# Patient Record
Sex: Female | Born: 2000 | Race: White | Hispanic: No | Marital: Single | Smoking: Never smoker
Health system: Southern US, Community
[De-identification: ages and names within clinical notes are randomized; demographics above are authoritative.]

## PROBLEM LIST (undated history)

## (undated) DIAGNOSIS — F329 Major depressive disorder, single episode, unspecified: Secondary | ICD-10-CM

## (undated) DIAGNOSIS — S43491A Other sprain of right shoulder joint, initial encounter: Secondary | ICD-10-CM

## (undated) DIAGNOSIS — F32A Depression, unspecified: Secondary | ICD-10-CM

## (undated) DIAGNOSIS — F419 Anxiety disorder, unspecified: Secondary | ICD-10-CM

## (undated) DIAGNOSIS — S43014A Anterior dislocation of right humerus, initial encounter: Secondary | ICD-10-CM

## (undated) DIAGNOSIS — B2790 Infectious mononucleosis, unspecified without complication: Secondary | ICD-10-CM

## (undated) HISTORY — DX: Major depressive disorder, single episode, unspecified: F32.9

## (undated) HISTORY — DX: Anterior dislocation of right humerus, initial encounter: S43.014A

## (undated) HISTORY — DX: Depression, unspecified: F32.A

## (undated) HISTORY — DX: Infectious mononucleosis, unspecified without complication: B27.90

## (undated) HISTORY — DX: Anxiety disorder, unspecified: F41.9

## (undated) HISTORY — DX: Other sprain of right shoulder joint, initial encounter: S43.491A

---
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
## 2020-02-23 ENCOUNTER — Other Ambulatory Visit: Payer: Self-pay

## 2020-02-23 ENCOUNTER — Encounter: Payer: Self-pay | Admitting: Nurse Practitioner

## 2020-02-23 ENCOUNTER — Ambulatory Visit: Payer: BC Managed Care – PPO | Admitting: Nurse Practitioner

## 2020-02-23 VITALS — BP 120/86 | HR 99 | Temp 98.6°F | Ht 65.5 in | Wt 166.0 lb

## 2020-02-23 DIAGNOSIS — J309 Allergic rhinitis, unspecified: Secondary | ICD-10-CM | POA: Insufficient documentation

## 2020-02-23 DIAGNOSIS — J029 Acute pharyngitis, unspecified: Secondary | ICD-10-CM | POA: Diagnosis not present

## 2020-02-23 DIAGNOSIS — J302 Other seasonal allergic rhinitis: Secondary | ICD-10-CM | POA: Diagnosis not present

## 2020-02-23 LAB — STREP GROUP A AG, W/REFLEX TO CULT: Streptococcus Group A AG: NOT DETECTED

## 2020-02-23 MED ORDER — FLUTICASONE PROPIONATE 50 MCG/ACT NA SUSP: 2.0000 | Freq: Every day | NASAL | 6 refills | Status: DC

## 2020-02-23 NOTE — Patient Instructions (Addendum)
F/u as needed  Allergies, Adult An allergy means that your body reacts to something that bothers it (allergen). This can happen from something that you eat, breathe in, or touch. Allergies often affect the nose, eyes, skin, and stomach. They can be mild, moderate, or very bad (severe). An allergy cannot spread from person to person. They can happen at any age. Sometimes, people outgrow them. What are the causes?  Outdoor things, such as pollen, car fumes, and mold.  Indoor things, such as dust, smoke, mold, and pets.  Foods.  Medicines.  Things that bother your skin, such as perfume and bug bites. What increases the risk?  Having family members with allergies or asthma. What are the signs or symptoms? Symptoms depend on how bad your allergy is. Mild to moderate symptoms  Runny nose, stuffy nose, or sneezing.  Itchy mouth, ears, or throat.  A feeling of mucus dripping down the back of your throat.  Sore throat.  Eyes that are itchy, red, watery, or puffy.  A skin rash, or red, swollen areas of skin (hives).  Stomach cramps or bloating. Severe symptoms Very bad allergies to food, medicine, or bug bites may cause a very bad allergy reaction (anaphylaxis). This can be life-threatening. Symptoms include:  A red face.  Wheezing or coughing.  Swollen lips, tongue, or mouth.  Tight or swollen throat.  Chest pain or tightness, or a fast heartbeat.  Trouble breathing or shortness of breath.  Pain in your belly (abdomen), vomiting, or watery poop (diarrhea).  Feeling dizzy or fainting. How is this treated? Treatment for this condition depends on your symptoms. Treatment may include:  Cold, wet cloths for itching and swelling.  Eye drops, nose sprays, or skin creams.  Washing out your nose each day.  A humidifier.  Medicines.  A change to the foods you eat.  Being exposed again and again to tiny amounts of allergens. This helps your body get used to them. You  might have: ? Allergy shots. ? Very small amounts of allergen put under your tongue.  An emergency shot (auto-injector pen) if you have a very bad allergy reaction. ? This is a medicine with a needle. You can put it into your skin by yourself. ? Your doctor will teach you how to use it.      Follow these instructions at home: Medicines  Take or apply over-the-counter and prescription medicines only as told by your doctor.  If you are at risk for a very bad allergy reaction, keep an auto-injector pen with you all the time.   Eating and drinking  Follow instructions from your doctor about what to eat and drink.  Drink enough fluid to keep your pee (urine) pale yellow. General instructions  If you have ever had a very bad allergy reaction, wear a medical alert bracelet or necklace.  Stay away from things that you are allergic to.  Keep all follow-up visits as told by your doctor. This is important. Contact a doctor if:  Your symptoms do not get better with treatment. Get help right away if:  You have symptoms of a very bad allergy reaction. These include: ? A swollen mouth, tongue, or throat. ? Pain or tightness in your chest. ? Trouble breathing. ? Being short of breath. ? Dizziness. ? Fainting. ? Very bad pain in your belly. ? Vomiting. ? Watery poop. These symptoms may be an emergency. Do not wait to see if the symptoms will go away. Get medical help right  away. Call your local emergency services (911 in the [place] Do not drive yourself to the hospital. Summary  Take or apply over-the-counter and prescription medicines only as told by your doctor.  Stay away from things you are allergic to.  If you are at risk for a very bad allergy reaction, carry an auto-injector pen all the time.  Wear a medical alert bracelet or necklace.  Very bad allergy reactions can be life-threatening. Get help right away. This information is not intended to replace advice given to you  by your health care provider. Make sure you discuss any questions you have with your health care provider. Document Revised: 10/29/2018 Document Reviewed: 10/29/2018 Elsevier Patient Education  2021 [employer].

## 2020-02-23 NOTE — Assessment & Plan Note (Addendum)
Chronic, ongoing.  Encouraged patient to resume cetirizine and flonase; refill given.  Mild posterior pharyngeal erythema on examination, not tonsillar exudate or lesions.  Rapid strep test negative, will send for culture and treat as indicated.  Sore throat likely secondary to allergic rhinitis.  Patient to call clinic if symptoms do not improve with allergy regimen.  Note for work given.

## 2020-02-23 NOTE — Progress Notes (Signed)
Subjective:    Patient ID: Marcia Hill, female    DOB: 12-25-2000, 20 y.o.   MRN: [id number]  HPI: Marcia Hill is a 20 y.o. female presenting for sore throat.  Chief Complaint  Patient presents with  . Sore Throat    exposed to strep on yesterday.   UPPER RESPIRATORY TRACT INFECTION Onset: yesterday Worst symptom: sore throat Fever: no Cough: yes; dry cough and can be productive Shortness of breath: no Wheezing: no Chest pain: no Chest tightness: no Chest congestion: yes Nasal congestion: yes Runny nose: no Post nasal drip: no Sneezing: no Sore throat: yes Swollen glands: no Sinus pressure: no Headache: no Face pain: no Toothache: no Ear pain: yes  Ear pressure: no  Eyes red/itching:no Eye drainage/crusting: no  Nausea: no  Vomiting: no Diarrhea: no  Change in appetite: no  Loss of taste/smell: no  Rash: no Fatigue: yes Sick contacts: yes; exposed to strep throat Saturday Strep contacts: yes  Context: stable Recurrent sinusitis: no Treatments attempted: no  Relief with OTC medications: nothing taken  No Known Allergies  Outpatient Encounter Medications as of 02/23/2020  Medication Sig  . fluticasone (FLONASE) 50 MCG/ACT nasal spray Place 2 sprays into both nostrils daily.  . [DISCONTINUED] acetaminophen (TYLENOL) 500 MG tablet Take 1 tablet (500 mg total) by mouth every 6 (six) hours as needed.  . [DISCONTINUED] albuterol (VENTOLIN HFA) 108 (90 Base) MCG/ACT inhaler Inhale 2 puffs into the lungs every 4 (four) hours as needed.  . [DISCONTINUED] guaiFENesin (MUCINEX) 600 MG 12 hr tablet Take 1 tablet (600 mg total) by mouth 2 (two) times daily as needed for cough or to loosen phlegm.  . [DISCONTINUED] hydrOXYzine (ATARAX/VISTARIL) 25 MG tablet TAKE 1 TO 2 TABLETS BY MOUTH EVERY DAY AS NEEDED FOR SEVERE ANXIETY.  . [DISCONTINUED] sertraline (ZOLOFT) 50 MG tablet Start with 1/2 tab each day for 4 days, and increase as directed to 2 tabs each day  .  [DISCONTINUED] triamcinolone (KENALOG) 0.1 % Apply 1 application topically 2 (two) times daily.   No facility-administered encounter medications on file as of 02/23/2020.    Patient Active Problem List   Diagnosis Date Noted  . Allergic rhinitis 02/23/2020  . Atopic dermatitis 09/15/2019    Past Medical History:  Diagnosis Date  . 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 Anxiety   . Depression   . Mononucleosis 09/27/2017  . Suspected COVID-19 virus infection 09/11/2019  . Upper respiratory tract infection 12/29/2019    Relevant past medical, surgical, family and social history reviewed and updated as indicated. Interim medical history since our last visit reviewed.  Review of Systems Per HPI unless specifically indicated above     Objective:    Pulse 99   Temp 98.6 F (37 C)   Ht 5' 5.5" (1.664 m)   Wt 166 lb (75.3 kg)   LMP 02/02/2020   SpO2 99%   BMI 27.20 kg/m   Wt Readings from Last 3 Encounters:  02/23/20 166 lb (75.3 kg) (90 %, Z= 1.30)*  11/17/19 158 lb (71.7 kg) (87 %, Z= 1.12)*  08/04/19 155 lb 3.2 oz (70.4 kg) (86 %, Z= 1.07)*   * Growth percentiles are based on CDC (Girls, 2-20 Years) data.    Physical Exam Vitals and nursing note reviewed.  Constitutional:      General: She is not in acute distress.    Appearance: She is well-developed. She  is not toxic-appearing.  HENT:     Head: Normocephalic and atraumatic.     Right Ear: Tympanic membrane and ear canal normal. No drainage. No middle ear effusion. Tympanic membrane is not erythematous.     Left Ear: Tympanic membrane and ear canal normal. No drainage.  No middle ear effusion. Tympanic membrane is not erythematous.     Nose: No congestion or rhinorrhea.     Mouth/Throat:     Mouth: Mucous membranes are dry. No oral lesions.     Pharynx: Posterior oropharyngeal erythema present. No uvula swelling.     Tonsils: No tonsillar exudate or  tonsillar abscesses.  Eyes:     Extraocular Movements:     Right eye: Normal extraocular motion.     Left eye: Normal extraocular motion.  Cardiovascular:     Rate and Rhythm: Normal rate and regular rhythm.     Heart sounds: Normal heart sounds. No murmur heard.   Pulmonary:     Effort: Pulmonary effort is normal. No respiratory distress.     Breath sounds: Normal breath sounds. No wheezing, rhonchi or rales.  Abdominal:     General: Bowel sounds are normal. There is no distension.     Palpations: Abdomen is soft.  Musculoskeletal:     Cervical back: Normal range of motion and neck supple.  Lymphadenopathy:     Cervical: No cervical adenopathy.  Skin:    General: Skin is warm and dry.     Capillary Refill: Capillary refill takes less than 2 seconds.     Coloration: Skin is not pale.     Findings: No erythema or rash.  Neurological:     General: No focal deficit present.     Mental Status: She is alert and oriented to person, place, and time.  Psychiatric:        Mood and Affect: Mood normal.        Behavior: Behavior normal.     Results for orders placed or performed in visit on 02/23/20  STREP GROUP A AG, W/REFLEX TO CULT   Specimen: Throat  Result Value Ref Range   Streptococcus Group A AG NOT DETECTED NOT DETECT      Assessment & Plan:   Problem List Items Addressed This Visit      Respiratory   Allergic rhinitis    Chronic, ongoing.  Encouraged patient to resume cetirizine and flonase; refill given.  Mild posterior pharyngeal erythema on examination, not tonsillar exudate or lesions.  Rapid strep test negative, will send for culture and treat as indicated.  Sore throat likely secondary to allergic rhinitis.  Patient to call clinic if symptoms do not improve with allergy regimen.  Note for work given.      Relevant Medications   fluticasone (FLONASE) 50 MCG/ACT nasal spray    Other Visit Diagnoses    Sore throat    -  Primary   Relevant Orders   STREP GROUP  A AG, W/REFLEX TO CULT (Completed)     Sore throat Acute, ongoing.  Rapid strep test negative; will send for culture.  Suspect post nasal drop is cause of sore throat; will resume regimen for allergic rhinitis.  Patient to let us know if this does not help.  - STREP GROUP A AG, W/REFLEX TO CULT    Follow up plan: Return if symptoms worsen or fail to improve.

## 2020-02-25 LAB — CULTURE, GROUP A STREP

## 2020-02-26 LAB — CULTURE, GROUP A STREP
MICRO NUMBER:: 11563705
SPECIMEN QUALITY:: ADEQUATE

## 2020-05-27 ENCOUNTER — Ambulatory Visit (INDEPENDENT_AMBULATORY_CARE_PROVIDER_SITE_OTHER): Payer: BC Managed Care – PPO | Admitting: Nurse Practitioner

## 2020-05-27 ENCOUNTER — Encounter: Payer: Self-pay | Admitting: Nurse Practitioner

## 2020-05-27 ENCOUNTER — Other Ambulatory Visit: Payer: Self-pay

## 2020-05-27 VITALS — Temp 98.2°F

## 2020-05-27 DIAGNOSIS — K529 Noninfective gastroenteritis and colitis, unspecified: Secondary | ICD-10-CM

## 2020-05-27 DIAGNOSIS — H00011 Hordeolum externum right upper eyelid: Secondary | ICD-10-CM

## 2020-05-27 NOTE — Patient Instructions (Signed)

## 2020-05-27 NOTE — Progress Notes (Signed)
Subjective:    Patient ID: Marcia Hill, female    DOB: 06/18/2000, 20 y.o.   MRN: [id number]  HPI: Marcia Hill is a 20 y.o. female presenting Via parking lot visit due to COVID-19 pandemic for vomiting and stye.  Chief Complaint  Patient presents with  . Vomiting   GASTROENTERITIS Patient reports a history of IBS.  2 days ago, she started having vomiting and threw up 6-7 times in 1 day.  She felt weak yesterday but has not vomited again since.  She has had some nausea. She reports her employer is requiring testing before she can return to work. Duration: days COVID-19 vaccination status: [employer] vaccine Diarrhea: no  Nausea: yes Vomiting: yes Episodes of vomit/day: 6-7 times Wednesday, none since Abdominal pain: yes Fever: no Decreased appetite: yes Loss of taste or smell: no Tolerating liquids: yes Foreign travel: no Relevant dietary history: none Similar illness in contacts: no Recent antibiotic use: no Status: better Treatments attempted: increasing fluids  EYE PAIN Patient reports stye to her upper lid of right eye.  She reports she gets these frequently and uses warm compresses which seems to help.  She reports it is not painful, her vision is not blurred and she does not have floaters.  She has noticed some pus draining from her eye.  She has been taking Tylenol which is helped with the eye strain she is feeling. Duration:  days Involved eye:  right Onset: sudden Severity: mild  Quality: sore Foreign body sensation:no Visual impairment: no Eye redness: yes Discharge: yes Crusting or matting of eyelids: no Swelling: yes Photophobia: no Itching: no Tearing: no Headache: no Floaters: no URI symptoms: no Contact lens use: no Close contacts with similar problems: no Eye trauma: no Aggravating factors: keeping eye open all day Alleviating factors: warm compresses, Tylenol Status: better  Treatments attempted: warm compresses  No Known  Allergies  Outpatient Encounter Medications as of 05/27/2020  Medication Sig  . fluticasone (FLONASE) 50 MCG/ACT nasal spray Place 2 sprays into both nostrils daily.   No facility-administered encounter medications on file as of 05/27/2020.    Patient Active Problem List   Diagnosis Date Noted  . Allergic rhinitis 02/23/2020  . Atopic dermatitis 09/15/2019    Past Medical History:  Diagnosis Date  . 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 Anxiety   . Depression   . Mononucleosis 09/27/2017  . Suspected COVID-19 virus infection 09/11/2019  . Upper respiratory tract infection 12/29/2019    Relevant past medical, surgical, family and social history reviewed and updated as indicated. Interim medical history since our last visit reviewed.  Review of Systems Per HPI unless specifically indicated above     Objective:    Temp 98.2 F (36.8 C) (Oral)   Wt Readings from Last 3 Encounters:  02/23/20 166 lb (75.3 kg) (90 %, Z= 1.30)*  11/17/19 158 lb (71.7 kg) (87 %, Z= 1.12)*  08/04/19 155 lb 3.2 oz (70.4 kg) (86 %, Z= 1.07)*   * Growth percentiles are based on CDC (Girls, 2-20 Years) data.    Physical Exam Vitals and nursing note reviewed.  Constitutional:      General: She is not in acute distress.    Appearance: Normal appearance. She is not toxic-appearing.  HENT:     Head: Normocephalic and atraumatic.     Right Ear: External ear normal.     Left Ear: External  ear normal.     Nose: Nose normal. No congestion.     Mouth/Throat:     Mouth: Mucous membranes are moist.     Pharynx: Oropharynx is clear. No oropharyngeal exudate or posterior oropharyngeal erythema.  Eyes:     General:        Right eye: Hordeolum present.        Left eye: No hordeolum.     Extraocular Movements: Extraocular movements intact.     Conjunctiva/sclera: Conjunctivae normal.     Right eye: Right conjunctiva is not injected. No  exudate or hemorrhage.    Left eye: Left conjunctiva is not injected. No exudate or hemorrhage. Neurological:     Mental Status: She is alert.        Assessment & Plan:  1. Gastroenteritis Acute.  Nausea and vomiting likely due to acute viral gastroenteritis.  COVID-19 testing obtained per workplace request.  Note for work given.  Continue supportive care - push fluids and follow BRAT diet for next couple of days.  - SARS-COV-2 RNA,(COVID-19) QUAL NAAT  2. Hordeolum externum of right upper eyelid Acute.  No orbit cellulitis of right eye. Encouraged continued use of warm compresses.  Can also use Tylenol or ibuprofen for pain.  Follow-up if symptoms not improving next week.     Follow up plan: Return if symptoms worsen or fail to improve.

## 2020-05-28 LAB — SARS-COV-2 RNA,(COVID-19) QUALITATIVE NAAT: SARS CoV2 RNA: NOT DETECTED

## 2020-08-11 ENCOUNTER — Telehealth (INDEPENDENT_AMBULATORY_CARE_PROVIDER_SITE_OTHER): Payer: BC Managed Care – PPO | Admitting: Nurse Practitioner

## 2020-08-11 ENCOUNTER — Encounter: Payer: Self-pay | Admitting: Nurse Practitioner

## 2020-08-11 DIAGNOSIS — F419 Anxiety disorder, unspecified: Secondary | ICD-10-CM | POA: Diagnosis not present

## 2020-08-11 DIAGNOSIS — F32A Depression, unspecified: Secondary | ICD-10-CM

## 2020-08-11 MED ORDER — HYDROXYZINE HCL 25 MG PO TABS: 25.0000 mg | ORAL_TABLET | Freq: Two times a day (BID) | ORAL | 1 refills | Status: DC | PRN
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
## 2020-08-15 ENCOUNTER — Telehealth: Payer: Self-pay

## 2020-08-15 DIAGNOSIS — F419 Anxiety disorder, unspecified: Secondary | ICD-10-CM

## 2020-08-15 DIAGNOSIS — F32A Depression, unspecified: Secondary | ICD-10-CM

## 2020-08-15 MED ORDER — LORAZEPAM 0.5 MG PO TABS
0.5000 mg | ORAL_TABLET | Freq: Two times a day (BID) | ORAL | 0 refills | Status: DC | PRN
Start: 2020-08-15 — End: 2020-11-07

## 2020-08-15 NOTE — Telephone Encounter (Signed)
Lorazepam sent to pharmacy.  This should only be taken short term and refill will not be given without office visit.

## 2020-08-17 NOTE — Telephone Encounter (Signed)
Call placed to patient and patient made aware.  

## 2020-08-29 ENCOUNTER — Other Ambulatory Visit: Payer: Self-pay | Admitting: Nurse Practitioner

## 2020-08-29 DIAGNOSIS — F32A Depression, unspecified: Secondary | ICD-10-CM

## 2020-11-02 ENCOUNTER — Ambulatory Visit (INDEPENDENT_AMBULATORY_CARE_PROVIDER_SITE_OTHER): Payer: BC Managed Care – PPO | Admitting: Nurse Practitioner

## 2020-11-02 ENCOUNTER — Other Ambulatory Visit: Payer: Self-pay

## 2020-11-02 DIAGNOSIS — J069 Acute upper respiratory infection, unspecified: Secondary | ICD-10-CM

## 2020-11-02 NOTE — Progress Notes (Signed)
Subjective:    Patient ID: Marcia Hill, female    DOB: 24-Nov-2000, 20 y.o.   MRN: [id number]  HPI: Marcia Hill is a 20 y.o. female presenting virtually for sore throat and congestion.  Chief Complaint  Patient presents with   Sore Throat   UPPER RESPIRATORY TRACT INFECTION Onset: 10/31 - woke up tired, worked her shift.  Not able to go to work yesterday. COVID-19 testing history: plans to take at home test today Fever: no Body aches: yes Chills: no Cough: yes; productive and worse when laying down Shortness of breath: no Wheezing: no Chest pain: no Chest tightness: no Chest congestion: yes Nasal congestion: no Hoarseness: yes Runny nose: no Post nasal drip: no Sneezing: no Sore throat: yes Swollen glands: yes Sinus pressure: no Headache: no Face pain: no Toothache: no Ear pain: no  Ear pressure: no  Eyes red/itching:no Eye drainage/crusting: no  Nausea: no  Vomiting: no Diarrhea: no  Change in appetite:  yes; decreased   Loss of taste/smell: no  Rash: no Fatigue: yes Sick contacts: yes; co-workers have had strep throat, everyone has been sick at work Strep contacts: yes  Context: fluctuating Recurrent sinusitis: no Treatments attempted: ibuprofen, Nyquil Relief with OTC medications: yes  No Known Allergies  Outpatient Encounter Medications as of 11/02/2020  Medication Sig   hydrOXYzine (ATARAX/VISTARIL) 25 MG tablet Take 1 tablet (25 mg total) by mouth 2 (two) times daily as needed for anxiety. Do not take while driving or operating heavy machinery if this medication makes you drowsy.   LORazepam (ATIVAN) 0.5 MG tablet Take 1 tablet (0.5 mg total) by mouth every 12 (twelve) hours as needed for anxiety. Do not take while driving or operating heavy machinery if this makes you sleepy.   No facility-administered encounter medications on file as of 11/02/2020.    Patient Active Problem List   Diagnosis Date Noted   Allergic rhinitis 02/23/2020    Atopic dermatitis 09/15/2019    Past Medical History:  Diagnosis Date   Anterior labral periosteal sleeve avulsion lesion of right shoulder    h/o dislocation managed by PT   Anxiety    Depression    Mononucleosis 09/27/2017   Suspected COVID-19 virus infection 09/11/2019   Upper respiratory tract infection 12/29/2019    Relevant past medical, surgical, family and social history reviewed and updated as indicated. Interim medical history since our last visit reviewed.  Review of Systems Per HPI unless specifically indicated above     Objective:    There were no vitals taken for this visit.  Wt Readings from Last 3 Encounters:  02/23/20 166 lb (75.3 kg) (90 %, Z= 1.30)*  11/17/19 158 lb (71.7 kg) (87 %, Z= 1.12)*  08/04/19 155 lb 3.2 oz (70.4 kg) (86 %, Z= 1.07)*   * Growth percentiles are based on CDC (Girls, 2-20 Years) data.    Physical Exam Physical examination unable to be performed due to lack of equipment.  Patient talking in complete sentences during telemedicine visit with occasional cough.     Assessment & Plan:  1. Upper respiratory tract infection, unspecified type Acute.  Likely viral in etiology - encouraged viral testing.  Patient reports she will take an at-home COVID-19 test.  Reassured patient that symptoms are most consistent with a viral upper respiratory infection and explained lack of efficacy of antibiotics against viruses.  Discussed expected course and features suggestive of secondary bacterial infection.  Continue supportive care. Increase fluid intake with water or electrolyte  solution like pedialyte. Encouraged acetaminophen as needed for fever/pain. Encouraged salt water gargling, chloraseptic spray and throat lozenges. Encouraged OTC guaifenesin. Encouraged saline sinus flushes and/or neti with humidified air. Follow up if symptoms do not continue improving.  Not for work given.     Follow up plan: Return if symptoms worsen or fail to improve.  This  visit was completed via telephone due to the restrictions of the COVID-19 pandemic. All issues as above were discussed and addressed but no physical exam was performed. If it was felt that the patient should be evaluated in the office, they were directed there. The patient verbally consented to this visit. Patient was unable to complete an audio/visual visit due to Lack of internet. Location of the patient: home Location of the provider: work Those involved with this call:  Provider: Cathlean Marseilles, DNP, FNP-C CMA: n/a Front Desk/Registration: Percival Spanish  Time spent on call:  11 minutes on the phone discussing health concerns. 15 minutes total spent in review of patient's record and preparation of their chart. I verified patient identity using two factors (patient name and date of birth). Patient consents verbally to being seen via telemedicine visit today.

## 2020-11-07 ENCOUNTER — Ambulatory Visit: Payer: BC Managed Care – PPO | Admitting: Family Medicine

## 2020-11-07 ENCOUNTER — Encounter: Payer: Self-pay | Admitting: Family Medicine

## 2020-11-07 ENCOUNTER — Other Ambulatory Visit: Payer: Self-pay

## 2020-11-07 VITALS — BP 120/64 | HR 100 | Temp 98.9°F | Resp 16 | Ht 65.5 in | Wt 129.0 lb

## 2020-11-07 DIAGNOSIS — F419 Anxiety disorder, unspecified: Secondary | ICD-10-CM

## 2020-11-07 DIAGNOSIS — F4310 Post-traumatic stress disorder, unspecified: Secondary | ICD-10-CM | POA: Diagnosis not present

## 2020-11-07 DIAGNOSIS — F32A Depression, unspecified: Secondary | ICD-10-CM | POA: Diagnosis not present

## 2020-11-07 DIAGNOSIS — F411 Generalized anxiety disorder: Secondary | ICD-10-CM | POA: Diagnosis not present

## 2020-11-07 MED ORDER — HYDROXYZINE HCL 25 MG PO TABS: 25.0000 mg | ORAL_TABLET | Freq: Two times a day (BID) | ORAL | 1 refills | Status: DC | PRN

## 2020-11-07 MED ORDER — LORAZEPAM 0.5 MG PO TABS: 0.5000 mg | ORAL_TABLET | Freq: Two times a day (BID) | ORAL | 0 refills | Status: DC | PRN

## 2020-11-07 MED ORDER — VORTIOXETINE HBR 10 MG PO TABS: 10.0000 mg | ORAL_TABLET | Freq: Every day | ORAL | 5 refills | Status: DC
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
## 2020-11-20 ENCOUNTER — Ambulatory Visit: Payer: Self-pay

## 2021-01-09 ENCOUNTER — Other Ambulatory Visit: Payer: Self-pay | Admitting: Family Medicine

## 2021-01-09 DIAGNOSIS — F32A Depression, unspecified: Secondary | ICD-10-CM

## 2021-01-09 DIAGNOSIS — F419 Anxiety disorder, unspecified: Secondary | ICD-10-CM

## 2021-02-21 ENCOUNTER — Other Ambulatory Visit: Payer: Self-pay

## 2021-02-21 ENCOUNTER — Ambulatory Visit
Admission: RE | Admit: 2021-02-21 | Discharge: 2021-02-21 | Disposition: A | Discharge status: Home / Self Care | Payer: BC Managed Care – PPO | Source: Ambulatory Visit | Attending: Emergency Medicine | Admitting: Emergency Medicine

## 2021-02-21 VITALS — BP 133/75 | HR 120 | Temp 98.2°F | Resp 18

## 2021-02-21 DIAGNOSIS — R21 Rash and other nonspecific skin eruption: Secondary | ICD-10-CM | POA: Diagnosis not present

## 2021-02-21 DIAGNOSIS — J029 Acute pharyngitis, unspecified: Secondary | ICD-10-CM | POA: Diagnosis not present

## 2021-02-21 LAB — POCT RAPID STREP A (OFFICE): Rapid Strep A Screen: NEGATIVE

## 2021-02-21 MED ORDER — PREDNISONE 10 MG (21) PO TBPK: ORAL_TABLET | Freq: Every day | ORAL | 0 refills | Status: DC

## 2021-02-21 NOTE — Discharge Instructions (Addendum)
Your strep test is negative.    Take the prednisone as directed.  Take Benadryl every 6 hours as directed; do not drive, operate machinery, or drink alcohol with this medication as it may cause drowsiness.    Follow up with your primary care provider if your symptoms are not improving.

## 2021-02-21 NOTE — ED Triage Notes (Signed)
Pt here with rash on torso and back x 1 day. Mildly ecchymotic and irritating. Denies changing any soaps, detergents or lotions.

## 2021-02-21 NOTE — ED Provider Notes (Signed)
Renaldo Fiddler    CSN: [id number] Arrival date & time: 02/21/21  1521      History   Chief Complaint Chief Complaint  Patient presents with   Rash    HPI Marcia Hill is a 21 y.o. female.  Patient presents with nontender, mildly pruritic rash on her abdomen and back x1 day.  She reports "scratchy" throat also.  No fever, congestion, cough, shortness of breath, vomiting, diarrhea, or other symptoms.  No new products, medications, foods.  Treatment at home with [employer] powder.  Her medical history includes COVID 3 weeks ago.  Her medical history also includes atopic dermatitis, allergic rhinitis, anxiety, depression.  She denies current pregnancy or breastfeeding.     The history is provided by the patient and medical records.   Past Medical History:  Diagnosis Date   Anterior labral periosteal sleeve avulsion lesion of right shoulder    h/o dislocation managed by PT   Anxiety    Depression    Mononucleosis 09/27/2017   Suspected COVID-19 virus infection 09/11/2019   Upper respiratory tract infection 12/29/2019    Patient Active Problem List   Diagnosis Date Noted   Allergic rhinitis 02/23/2020   Atopic dermatitis 09/15/2019    History reviewed. No pertinent surgical history.  OB History   No obstetric history on file.      Home Medications    Prior to Admission medications   Medication Sig Start Date End Date Taking? Authorizing Provider  predniSONE (STERAPRED UNI-PAK 21 TAB) 10 MG (21) TBPK tablet Take by mouth daily. As directed 02/21/21  Yes Mickie Bail, NP  hydrOXYzine (ATARAX) 25 MG tablet TAKE 1 TABLET BY MOUTH TWICE DAILY AS NEEDED FOR ANXIETY 01/09/21   Donita Brooks, MD  LORazepam (ATIVAN) 0.5 MG tablet Take 1 tablet (0.5 mg total) by mouth every 12 (twelve) hours as needed for anxiety. Do not take while driving or operating heavy machinery if this makes you sleepy. 11/07/20   Donita Brooks, MD  vortioxetine HBr (TRINTELLIX) 10 MG TABS tablet  Take 1 tablet (10 mg total) by mouth daily. 11/07/20   Donita Brooks, MD    Family History Family History  Problem Relation Age of Onset   ADD / ADHD Sister    Drug abuse Maternal Uncle    Depression Maternal Uncle    Dementia Paternal Grandmother     Social History Social History   Tobacco Use   Smoking status: Never   Smokeless tobacco: Never  Vaping Use   Vaping Use: Every day  Substance Use Topics   Alcohol use: Yes    Alcohol/week: 3.0 standard drinks    Types: 3 Glasses of wine per week    Comment: rarely   Drug use: Never     Allergies   Patient has no known allergies.   Review of Systems Review of Systems  Constitutional:  Negative for chills and fever.  HENT:  Positive for sore throat. Negative for ear pain.   Respiratory:  Negative for cough and shortness of breath.   Cardiovascular:  Negative for chest pain and palpitations.  Gastrointestinal:  Negative for diarrhea and vomiting.  Musculoskeletal:  Negative for arthralgias and joint swelling.  Skin:  Positive for rash. Negative for color change.  All other systems reviewed and are negative.   Physical Exam Triage Vital Signs ED Triage Vitals  Enc Vitals Group     BP      Pulse      Resp  Temp      Temp src      SpO2      Weight      Height      Head Circumference      Peak Flow      Pain Score      Pain Loc      Pain Edu?      Excl. in GC?    No data found.  Updated Vital Signs BP 133/75    Pulse (!) 120    Temp 98.2 F (36.8 C)    Resp 18    SpO2 95%   Visual Acuity Right Eye Distance:   Left Eye Distance:   Bilateral Distance:    Right Eye Near:   Left Eye Near:    Bilateral Near:     Physical Exam Vitals and nursing note reviewed.  Constitutional:      General: She is not in acute distress.    Appearance: Normal appearance. She is well-developed. She is not ill-appearing.  HENT:     Right Ear: Tympanic membrane normal.     Left Ear: Tympanic membrane normal.      Nose: Nose normal.     Mouth/Throat:     Mouth: Mucous membranes are moist.     Pharynx: Oropharynx is clear.  Eyes:     Conjunctiva/sclera: Conjunctivae normal.  Cardiovascular:     Rate and Rhythm: Normal rate and regular rhythm.     Heart sounds: Normal heart sounds.  Pulmonary:     Effort: Pulmonary effort is normal. No respiratory distress.     Breath sounds: Normal breath sounds.  Musculoskeletal:     Cervical back: Neck supple.  Skin:    General: Skin is warm and dry.     Findings: Rash present.     Comments: Scattered light red papular rash on abdomen and back.  See pictures.   Neurological:     Mental Status: She is alert.  Psychiatric:        Mood and Affect: Mood normal.        Behavior: Behavior normal.        UC Treatments / Results  Labs (all labs ordered are listed, but only abnormal results are displayed) Labs Reviewed  CULTURE, GROUP A STREP [facility])  POCT RAPID STREP A (OFFICE)    EKG   Radiology No results found.  Procedures Procedures (including critical care time)  Medications Ordered in UC Medications - No data to display  Initial Impression / Assessment and Plan / UC Course  I have reviewed the triage vital signs and the nursing notes.  Pertinent labs & imaging results that were available during my care of the patient were reviewed by me and considered in my medical decision making (see chart for details).   Rash, sore throat.  Rapid strep negative; culture pending.  Treating rash with prednisone taper and Benadryl.  Discussed precautions for drowsiness with Benadryl.  Instructed her to follow-up with her PCP or dermatologist if her symptoms are not improving.  She agrees to plan of care.   Final Clinical Impressions(s) / UC Diagnoses   Final diagnoses:  Rash  Sore throat     Discharge Instructions      Your strep test is negative.    Take the prednisone as directed.  Take Benadryl every 6 hours as directed; do not drive,  operate machinery, or drink alcohol with this medication as it may cause drowsiness.    Follow up with  your primary care provider if your symptoms are not improving.         ED Prescriptions     Medication Sig Dispense Auth. Provider   predniSONE (STERAPRED UNI-PAK 21 TAB) 10 MG (21) TBPK tablet Take by mouth daily. As directed 21 tablet Mickie Bail, NP      PDMP not reviewed this encounter.   Mickie Bail, NP 02/21/21 [phone]

## 2021-02-23 ENCOUNTER — Ambulatory Visit (HOSPITAL_COMMUNITY)
Admission: EM | Admit: 2021-02-23 | Discharge: 2021-02-24 | Disposition: A | Discharge status: Other Acute Inpatient Hospital | Payer: BC Managed Care – PPO | Attending: Family | Admitting: Family

## 2021-02-23 DIAGNOSIS — R45851 Suicidal ideations: Secondary | ICD-10-CM | POA: Insufficient documentation

## 2021-02-23 DIAGNOSIS — Z62820 Parent-biological child conflict: Secondary | ICD-10-CM | POA: Insufficient documentation

## 2021-02-23 DIAGNOSIS — Z9152 Personal history of nonsuicidal self-harm: Secondary | ICD-10-CM | POA: Insufficient documentation

## 2021-02-23 DIAGNOSIS — R238 Other skin changes: Secondary | ICD-10-CM | POA: Insufficient documentation

## 2021-02-23 DIAGNOSIS — F332 Major depressive disorder, recurrent severe without psychotic features: Secondary | ICD-10-CM | POA: Insufficient documentation

## 2021-02-23 DIAGNOSIS — Z20822 Contact with and (suspected) exposure to covid-19: Secondary | ICD-10-CM | POA: Diagnosis not present

## 2021-02-23 DIAGNOSIS — Z79899 Other long term (current) drug therapy: Secondary | ICD-10-CM | POA: Diagnosis not present

## 2021-02-23 DIAGNOSIS — F419 Anxiety disorder, unspecified: Secondary | ICD-10-CM | POA: Diagnosis present

## 2021-02-23 DIAGNOSIS — Z638 Other specified problems related to primary support group: Secondary | ICD-10-CM | POA: Diagnosis not present

## 2021-02-23 LAB — COMPREHENSIVE METABOLIC PANEL
ALT: 21 U/L (ref 0–44)
AST: 24 U/L (ref 15–41)
Albumin: 4.2 g/dL (ref 3.5–5.0)
Alkaline Phosphatase: 63 U/L (ref 38–126)
Anion gap: 9 (ref 5–15)
BUN: <5 mg/dL — ABNORMAL LOW (ref 6–20)
CO2: 25 mmol/L (ref 22–32)
Calcium: 9.5 mg/dL (ref 8.9–10.3)
Chloride: 103 mmol/L (ref 98–111)
Creatinine, Ser: 0.66 mg/dL (ref 0.44–1.00)
GFR, Estimated: >60 mL/min (ref >60)
Glucose, Bld: 78 mg/dL (ref 70–99)
Potassium: 4.4 mmol/L (ref 3.5–5.1)
Sodium: 137 mmol/L (ref 135–145)
Total Bilirubin: 1 mg/dL (ref 0.3–1.2)
Total Protein: 7.3 g/dL (ref 6.5–8.1)

## 2021-02-23 LAB — LIPID PANEL
Cholesterol: 170 mg/dL (ref 0–200)
HDL: 84 mg/dL (ref >40)
LDL Cholesterol: 79 mg/dL (ref 0–99)
Total CHOL/HDL Ratio: 2 RATIO
Triglycerides: 37 mg/dL (ref <150)
VLDL: 7 mg/dL (ref 0–40)

## 2021-02-23 LAB — POCT URINE DRUG SCREEN - MANUAL ENTRY (I-SCREEN)
POC Amphetamine UR: NOT DETECTED
POC Buprenorphine (BUP): POSITIVE — AB
POC Cocaine UR: NOT DETECTED
POC Marijuana UR: POSITIVE — AB
POC Methadone UR: NOT DETECTED
POC Methamphetamine UR: NOT DETECTED
POC Morphine: NOT DETECTED
POC Oxazepam (BZO): NOT DETECTED
POC Oxycodone UR: NOT DETECTED
POC Secobarbital (BAR): NOT DETECTED

## 2021-02-23 LAB — MAGNESIUM: Magnesium: 2.1 mg/dL (ref 1.7–2.4)

## 2021-02-23 LAB — CBC WITH DIFFERENTIAL/PLATELET
Abs Immature Granulocytes: 0.02 10*3/uL (ref 0.00–0.07)
Basophils Absolute: 0 10*3/uL (ref 0.0–0.1)
Basophils Relative: 1 %
Eosinophils Absolute: 0 10*3/uL (ref 0.0–0.5)
Eosinophils Relative: 1 %
HCT: 40.2 % (ref 36.0–46.0)
Hemoglobin: 13.2 g/dL (ref 12.0–15.0)
Immature Granulocytes: 0 %
Lymphocytes Relative: 12 %
Lymphs Abs: 0.8 10*3/uL (ref 0.7–4.0)
MCH: 28.4 pg (ref 26.0–34.0)
MCHC: 32.8 g/dL (ref 30.0–36.0)
MCV: 86.6 fL (ref 80.0–100.0)
Monocytes Absolute: 0.3 10*3/uL (ref 0.1–1.0)
Monocytes Relative: 4 %
Neutro Abs: 5.4 10*3/uL (ref 1.7–7.7)
Neutrophils Relative %: 82 %
Platelets: 317 10*3/uL (ref 150–400)
RBC: 4.64 MIL/uL (ref 3.87–5.11)
RDW: 13.8 % (ref 11.5–15.5)
WBC: 6.6 10*3/uL (ref 4.0–10.5)
nRBC: 0 % (ref 0.0–0.2)

## 2021-02-23 LAB — POC SARS CORONAVIRUS 2 AG -  ED: SARS Coronavirus 2 Ag: NEGATIVE

## 2021-02-23 LAB — ETHANOL: Alcohol, Ethyl (B): <10 mg/dL (ref <10)

## 2021-02-23 LAB — RESP PANEL BY RT-PCR (FLU A&B, COVID) ARPGX2
Influenza A by PCR: NEGATIVE
Influenza B by PCR: NEGATIVE
SARS Coronavirus 2 by RT PCR: NEGATIVE

## 2021-02-23 LAB — TSH: TSH: 0.672 u[IU]/mL (ref 0.350–4.500)

## 2021-02-23 MED ORDER — NICOTINE POLACRILEX 2 MG MT GUM: 2.0000 mg | CHEWING_GUM | OROMUCOSAL | Status: DC | PRN

## 2021-02-23 MED ORDER — HYDROXYZINE HCL 25 MG PO TABS
25.0000 mg | ORAL_TABLET | Freq: Three times a day (TID) | ORAL | Status: DC | PRN
  Administered 2021-02-23 – 2021-02-24 (×2): 25 mg via ORAL
  Filled 2021-02-23 (×3): qty 1

## 2021-02-23 MED ORDER — PREDNISONE 5 MG (21) PO TBPK
10.0000 mg | ORAL_TABLET | Freq: Every evening | ORAL | Status: DC
Start: 2021-02-24 — End: 2021-02-23

## 2021-02-23 MED ORDER — PREDNISONE 5 MG (21) PO TBPK
10.0000 mg | ORAL_TABLET | Freq: Every morning | ORAL | Status: DC
  Filled 2021-02-23: qty 21

## 2021-02-23 MED ORDER — PREDNISONE 5 MG (21) PO TBPK: 5.0000 mg | ORAL_TABLET | Freq: Four times a day (QID) | ORAL | Status: DC

## 2021-02-23 MED ORDER — PREDNISONE 5 MG (21) PO TBPK
10.0000 mg | ORAL_TABLET | Freq: Every evening | ORAL | Status: DC
Start: 2021-02-23 — End: 2021-02-23

## 2021-02-23 MED ORDER — ALUM & MAG HYDROXIDE-SIMETH 200-200-20 MG/5ML PO SUSP: 30.0000 mL | ORAL | Status: DC | PRN

## 2021-02-23 MED ORDER — PREDNISONE 5 MG (21) PO TBPK: 5.0000 mg | ORAL_TABLET | ORAL | Status: DC

## 2021-02-23 MED ORDER — PREDNISONE 5 MG (21) PO TBPK
5.0000 mg | ORAL_TABLET | Freq: Three times a day (TID) | ORAL | Status: DC
Start: 2021-02-24 — End: 2021-02-23

## 2021-02-23 MED ORDER — TRAZODONE HCL 50 MG PO TABS
50.0000 mg | ORAL_TABLET | Freq: Every evening | ORAL | Status: DC | PRN
  Administered 2021-02-23: 50 mg via ORAL
  Filled 2021-02-23: qty 1

## 2021-02-23 MED ORDER — MAGNESIUM HYDROXIDE 400 MG/5ML PO SUSP: 30.0000 mL | Freq: Every day | ORAL | Status: DC | PRN

## 2021-02-23 MED ORDER — ACETAMINOPHEN 325 MG PO TABS: 650.0000 mg | ORAL_TABLET | Freq: Four times a day (QID) | ORAL | Status: DC | PRN

## 2021-02-23 NOTE — ED Triage Notes (Signed)
Pt presents to [facility] accompanied by her mom. Pt reports feeling suicidal and has a plan to overdose on medications. Pt has diagnosis of depression and anxiety. Pt reports having a bad break up back in August and has had a diffucult time coping. Pt has seen a psychiatrist since the age of 3, but stopped omce she turned 14. Pt reports using alcohol and marijuana for sleep in the past, but more recently has been abusing it. Pt denies HI and AVH. Pt is emergent.

## 2021-02-23 NOTE — ED Notes (Signed)
Patient arrived on unit. Patient calm and cooperative. Patient had meal. Patient safe on unit with continued monitoring.

## 2021-02-23 NOTE — ED Notes (Addendum)
Pt is annoyed she isn't being transferred. Referred her to seria the RN to speak about future plans. Pt. Made a phone call and is now crying and sitting on her bed. She saids she doesn't want to be in a room collecting her thoughts.
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
## 2021-02-23 NOTE — ED Notes (Signed)
Gave pt coloring pages and puzzles to take her mind off things. Shes been doing much better since.

## 2021-02-23 NOTE — ED Notes (Signed)
STAT lab courier called to transport labs to [facility] lab.  Unable to collect urine at this time.  Pt reports recently using the restroom. Will provide fluids and encourage pt for sample.

## 2021-02-23 NOTE — ED Notes (Signed)
Pt laying in bed calm and cooperative@this  time. No c/o pain or distress. Will continue to monitor for safety

## 2021-02-23 NOTE — ED Notes (Signed)
PT visibly upset awaiting for covid results. This nurse advised the patient that her results are negative. Pt then wanted to know when she would be going to the other unit she was advised about by the provider during her assessment this nurse advised patient that she wouldn't be leaving until tomorrow.
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
## 2021-02-24 ENCOUNTER — Encounter (HOSPITAL_COMMUNITY): Payer: Self-pay | Admitting: Family

## 2021-02-24 ENCOUNTER — Other Ambulatory Visit: Payer: Self-pay

## 2021-02-24 ENCOUNTER — Inpatient Hospital Stay (HOSPITAL_COMMUNITY)
Admission: AD | Admit: 2021-02-24 | Discharge: 2021-02-28 | DRG: 885 | Disposition: A | Discharge status: Home / Self Care | Payer: BC Managed Care – PPO | Source: Ambulatory Visit | Attending: Psychiatry | Admitting: Psychiatry

## 2021-02-24 DIAGNOSIS — R21 Rash and other nonspecific skin eruption: Secondary | ICD-10-CM | POA: Diagnosis present

## 2021-02-24 DIAGNOSIS — Z818 Family history of other mental and behavioral disorders: Secondary | ICD-10-CM | POA: Diagnosis not present

## 2021-02-24 DIAGNOSIS — Z91411 Personal history of adult psychological abuse: Secondary | ICD-10-CM

## 2021-02-24 DIAGNOSIS — F4310 Post-traumatic stress disorder, unspecified: Secondary | ICD-10-CM | POA: Diagnosis present

## 2021-02-24 DIAGNOSIS — G4700 Insomnia, unspecified: Secondary | ICD-10-CM | POA: Diagnosis present

## 2021-02-24 DIAGNOSIS — Z20822 Contact with and (suspected) exposure to covid-19: Secondary | ICD-10-CM | POA: Diagnosis present

## 2021-02-24 DIAGNOSIS — Z79899 Other long term (current) drug therapy: Secondary | ICD-10-CM

## 2021-02-24 DIAGNOSIS — Z62811 Personal history of psychological abuse in childhood: Secondary | ICD-10-CM | POA: Diagnosis present

## 2021-02-24 DIAGNOSIS — F332 Major depressive disorder, recurrent severe without psychotic features: Principal | ICD-10-CM | POA: Diagnosis present

## 2021-02-24 DIAGNOSIS — Z7952 Long term (current) use of systemic steroids: Secondary | ICD-10-CM

## 2021-02-24 DIAGNOSIS — Z91410 Personal history of adult physical and sexual abuse: Secondary | ICD-10-CM | POA: Diagnosis not present

## 2021-02-24 DIAGNOSIS — F17290 Nicotine dependence, other tobacco product, uncomplicated: Secondary | ICD-10-CM | POA: Diagnosis present

## 2021-02-24 DIAGNOSIS — R45851 Suicidal ideations: Secondary | ICD-10-CM | POA: Diagnosis present

## 2021-02-24 DIAGNOSIS — Z62810 Personal history of physical and sexual abuse in childhood: Secondary | ICD-10-CM | POA: Diagnosis present

## 2021-02-24 LAB — CULTURE, GROUP A STREP (THRC)

## 2021-02-24 LAB — HEMOGLOBIN A1C
Hgb A1c MFr Bld: 4.8 % (ref 4.8–5.6)
Mean Plasma Glucose: 91.06 mg/dL

## 2021-02-24 MED ORDER — TRAZODONE HCL 50 MG PO TABS
50.0000 mg | ORAL_TABLET | Freq: Once | ORAL | Status: DC
Start: 2021-02-24 — End: 2021-02-24

## 2021-02-24 MED ORDER — PREDNISONE 10 MG PO TABS
10.0000 mg | ORAL_TABLET | Freq: Every day | ORAL | Status: AC
  Administered 2021-02-28: 10 mg via ORAL
  Filled 2021-02-24: qty 1

## 2021-02-24 MED ORDER — PREDNISONE 10 MG PO TABS: 10.0000 mg | ORAL_TABLET | Freq: Two times a day (BID) | ORAL | Status: DC

## 2021-02-24 MED ORDER — THIAMINE HCL 100 MG PO TABS
100.0000 mg | ORAL_TABLET | Freq: Every day | ORAL | Status: DC
  Administered 2021-02-25 – 2021-02-28 (×4): 100 mg via ORAL
  Filled 2021-02-24 (×7): qty 1

## 2021-02-24 MED ORDER — HYDROXYZINE HCL 50 MG PO TABS
50.0000 mg | ORAL_TABLET | Freq: Three times a day (TID) | ORAL | Status: DC | PRN
  Administered 2021-02-24 – 2021-02-26 (×4): 50 mg via ORAL
  Filled 2021-02-24 (×4): qty 1

## 2021-02-24 MED ORDER — TRAZODONE HCL 50 MG PO TABS
50.0000 mg | ORAL_TABLET | Freq: Every evening | ORAL | Status: DC | PRN
  Administered 2021-02-24: 50 mg via ORAL
  Filled 2021-02-24: qty 1

## 2021-02-24 MED ORDER — ENSURE ENLIVE PO LIQD
237.0000 mL | Freq: Two times a day (BID) | ORAL | Status: DC
  Administered 2021-02-25 – 2021-02-28 (×8): 237 mL via ORAL
  Filled 2021-02-24 (×11): qty 237

## 2021-02-24 MED ORDER — PREDNISONE 20 MG PO TABS
30.0000 mg | ORAL_TABLET | Freq: Every day | ORAL | Status: AC
  Administered 2021-02-26: 30 mg via ORAL
  Filled 2021-02-24: qty 1

## 2021-02-24 MED ORDER — PREDNISONE 10 MG PO TABS
10.0000 mg | ORAL_TABLET | Freq: Once | ORAL | Status: AC
  Administered 2021-02-24: 10 mg via ORAL
  Filled 2021-02-24: qty 1

## 2021-02-24 MED ORDER — NICOTINE POLACRILEX 2 MG MT GUM
2.0000 mg | CHEWING_GUM | OROMUCOSAL | Status: DC | PRN
  Administered 2021-02-25 – 2021-02-26 (×4): 2 mg via ORAL

## 2021-02-24 MED ORDER — LORAZEPAM 1 MG PO TABS: 1.0000 mg | ORAL_TABLET | Freq: Four times a day (QID) | ORAL | Status: DC | PRN

## 2021-02-24 MED ORDER — LORAZEPAM 1 MG PO TABS
1.0000 mg | ORAL_TABLET | Freq: Four times a day (QID) | ORAL | Status: AC | PRN
  Administered 2021-02-24: 1 mg via ORAL
  Filled 2021-02-24: qty 1

## 2021-02-24 MED ORDER — LORAZEPAM 1 MG PO TABS
1.0000 mg | ORAL_TABLET | Freq: Once | ORAL | Status: AC
  Administered 2021-02-24: 1 mg via ORAL
  Filled 2021-02-24: qty 1

## 2021-02-24 MED ORDER — PREDNISONE 10 MG PO TABS
10.0000 mg | ORAL_TABLET | Freq: Once | ORAL | Status: DC
  Filled 2021-02-24: qty 1

## 2021-02-24 MED ORDER — PREDNISONE 20 MG PO TABS
40.0000 mg | ORAL_TABLET | Freq: Every day | ORAL | Status: AC
  Administered 2021-02-25: 40 mg via ORAL
  Filled 2021-02-24: qty 2

## 2021-02-24 MED ORDER — ACETAMINOPHEN 325 MG PO TABS: 650.0000 mg | ORAL_TABLET | Freq: Four times a day (QID) | ORAL | Status: DC | PRN

## 2021-02-24 MED ORDER — PREDNISONE 10 MG PO TABS
10.0000 mg | ORAL_TABLET | Freq: Two times a day (BID) | ORAL | Status: DC
  Administered 2021-02-24: 10 mg via ORAL
  Filled 2021-02-24: qty 1

## 2021-02-24 MED ORDER — ONDANSETRON 4 MG PO TBDP
4.0000 mg | ORAL_TABLET | Freq: Four times a day (QID) | ORAL | Status: AC | PRN
  Administered 2021-02-27: 4 mg via ORAL
  Filled 2021-02-24: qty 1

## 2021-02-24 MED ORDER — LORAZEPAM 1 MG PO TABS
1.0000 mg | ORAL_TABLET | Freq: Once | ORAL | Status: AC
Start: 2021-02-24 — End: 2021-02-24
  Administered 2021-02-24: 1 mg via ORAL
  Filled 2021-02-24: qty 1

## 2021-02-24 MED ORDER — PREDNISONE 10 MG PO TABS: 10.0000 mg | ORAL_TABLET | Freq: Three times a day (TID) | ORAL | Status: DC

## 2021-02-24 MED ORDER — PREDNISONE 20 MG PO TABS
20.0000 mg | ORAL_TABLET | Freq: Once | ORAL | Status: DC
Start: 2021-02-25 — End: 2021-02-24

## 2021-02-24 MED ORDER — ADULT MULTIVITAMIN W/MINERALS CH
1.0000 | ORAL_TABLET | Freq: Every day | ORAL | Status: DC
  Administered 2021-02-25 – 2021-02-28 (×4): 1 via ORAL
  Filled 2021-02-24 (×6): qty 1

## 2021-02-24 MED ORDER — PREDNISONE 20 MG PO TABS
20.0000 mg | ORAL_TABLET | Freq: Every day | ORAL | Status: AC
  Administered 2021-02-27: 20 mg via ORAL
  Filled 2021-02-24: qty 1

## 2021-02-24 MED ORDER — LOPERAMIDE HCL 2 MG PO CAPS: 2.0000 mg | ORAL_CAPSULE | ORAL | Status: AC | PRN

## 2021-02-24 MED ORDER — HYDROXYZINE HCL 25 MG PO TABS
25.0000 mg | ORAL_TABLET | Freq: Three times a day (TID) | ORAL | Status: DC | PRN
  Administered 2021-02-24: 25 mg via ORAL
  Filled 2021-02-24: qty 1

## 2021-02-24 MED ORDER — ONDANSETRON 4 MG PO TBDP: 4.0000 mg | ORAL_TABLET | Freq: Four times a day (QID) | ORAL | Status: DC | PRN

## 2021-02-24 MED ORDER — ALUM & MAG HYDROXIDE-SIMETH 200-200-20 MG/5ML PO SUSP: 30.0000 mL | ORAL | Status: DC | PRN

## 2021-02-24 MED ORDER — MAGNESIUM HYDROXIDE 400 MG/5ML PO SUSP: 30.0000 mL | Freq: Every day | ORAL | Status: DC | PRN

## 2021-02-24 MED ORDER — HYDROCORTISONE 1 % EX CREA
TOPICAL_CREAM | Freq: Three times a day (TID) | CUTANEOUS | Status: DC
  Administered 2021-02-24: 1 via TOPICAL
  Filled 2021-02-24 (×2): qty 28

## 2021-02-24 MED ORDER — PREDNISONE 20 MG PO TABS
30.0000 mg | ORAL_TABLET | Freq: Once | ORAL | Status: AC
  Administered 2021-02-24: 30 mg via ORAL
  Filled 2021-02-24: qty 1

## 2021-02-24 MED ORDER — LOPERAMIDE HCL 2 MG PO CAPS: 2.0000 mg | ORAL_CAPSULE | ORAL | Status: DC | PRN
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
## 2021-02-24 NOTE — ED Notes (Signed)
Pt discharged with  AVS.  AVS reviewed prior to discharge.  Pt alert, oriented, and ambulatory.  Safety maintained.  °

## 2021-02-24 NOTE — Progress Notes (Signed)
Patient attend AA wrap up group. 

## 2021-02-24 NOTE — ED Notes (Signed)
Patient refused Lunch.

## 2021-02-24 NOTE — ED Notes (Signed)
Pt was trying to use the phone to call her mom and tell her to pick her up. We explained the phones come on at 0730

## 2021-02-24 NOTE — Progress Notes (Signed)
Pt exhibits some symptoms of withdrawal from self reported moderate to severe marijuana and benzodiazapines (Xanax).  She is somewhat argumentative with staff and attempts to staff split.    Pt states that she is no longer interested in an inpatient stay and will speak to the provider in the morning to request discharge.  Pt received a 1 time dose of Ativan 1 mg PO at 0025 hours which was ineffective to control her increasing anxiety.  Pt received an additional one time dose of Ativan 1 mg PO at 0240 hours.

## 2021-02-24 NOTE — Group Note (Signed)
LCSW Group Therapy Note °  °  °Group Date: 02/24/2021 °Start Time: 1300 °End Time: 1400 °  °Therapy Type: Group Therapy °  °Participation Level:  Did not attend °  °Description of Group: Patients received a worksheet with an outline of 2 faces. One side is designated for what the pt sees about themselves and the other is what others see. Pt's were asked to introduce themselves and share something they like about themself. Pts were then asked to draw, write or color how they view themselves as well as how they are viewed by others. CSW led discussion about the feelings and words associated with each side.  °  °Patient Summary: Did not attend ° ° ° °Taheerah Guldin, LCSW, LCAS °Clincal Social Worker  °[facility] °  °

## 2021-02-24 NOTE — Tx Team (Signed)
Initial Treatment Plan 02/24/2021 4:13 PM Marcia Hill [id number]    PATIENT STRESSORS: Loss of relationship   Occupational concerns   Substance abuse   Traumatic event     PATIENT STRENGTHS: Active sense of humor  Capable of independent living  Communication skills  Financial means  Work skills    PATIENT IDENTIFIED PROBLEMS: Depression  Anxiety  Substance abuse  Living arrangements/new location   Family conflict  Issues with ex-fiance            DISCHARGE CRITERIA:  Improved stabilization in mood, thinking, and/or behavior Motivation to continue treatment in a less acute level of care Withdrawal symptoms are absent or subacute and managed without 24-hour nursing intervention  PRELIMINARY DISCHARGE PLAN: Attend PHP/IOP Outpatient therapy Placement in alternative living arrangements  PATIENT/FAMILY INVOLVEMENT: This treatment plan has been presented to and reviewed with the patient, Marcia Hill.  The patient has been given the opportunity to ask questions and make suggestions.  Sofie Hartigan, RN 02/24/2021, 4:13 PM

## 2021-02-24 NOTE — Progress Notes (Signed)
CSW requested that [facility] review pt for inpatient. Pt meets criteria per Doran Heater, NP. CSW will assist and follow with placement.    Maryjean Ka, MSW, LCSWA 02/24/2021 1:04 AM

## 2021-02-24 NOTE — ED Notes (Signed)
Pt. Again talked to the nurses (3) about leaving again shes very upset that she was apparently told she would be leaving today to go to [facility]. She has clearly stated she doesn't want to be in the room (flex) and she wants to leave. RN Has explained that she can not leave. A/C RN Hilda Lias is now over here talking to her. I just let her use the bathroom and waited. She still is crying.

## 2021-02-24 NOTE — Progress Notes (Signed)
°   02/24/21 2256  Psych Admission Type (Psych Patients Only)  Admission Status Voluntary  Psychosocial Assessment  Patient Complaints Insomnia  Eye Contact Fair  Facial Expression Flat;Anxious  Affect Appropriate to circumstance  Speech Logical/coherent  Interaction Assertive  Motor Activity Other (Comment) (WDL)  Appearance/Hygiene In scrubs  Behavior Characteristics Appropriate to situation  Mood Anxious  Thought Process  Coherency WDL  Content WDL  Delusions None reported or observed  Perception WDL  Hallucination None reported or observed  Judgment Impaired  Confusion None  Danger to Self  Current suicidal ideation? Denies  Danger to Others  Danger to Others None reported or observed

## 2021-02-24 NOTE — ED Notes (Signed)
Pt is currently sleeping, no distress noted, environmental check complete, will continue to monitor patient for safety. ? ?

## 2021-02-24 NOTE — ED Notes (Signed)
Patient refused to eat breakfast this morning.

## 2021-02-24 NOTE — ED Notes (Signed)
Pt. Is up sitting on the bed. Saids that she can't sleep nurse gave night meds. She is very anxious and wants to leave.

## 2021-02-24 NOTE — ED Notes (Signed)
Pt is constantly coming to the desk asking what time it is, and what time can she get that assessment? We explained to her multiple times a Np will come around in the morning and reassess the patients that are here. She has declined her bed at [facility] and wants to go home. We explained to her that due to the nature of why she is  here we could not just let her go. She refuses to accept that answer and states that being here is not making her feel any better, and she also feels that she has been lied to about inpatient treatment.

## 2021-02-24 NOTE — ED Notes (Signed)
Report called to [facility] and safe transport has been arranged.

## 2021-02-24 NOTE — ED Notes (Signed)
Pt up sitting on side of recliner.  Pt denies SI, HI, and AVH at this time.  Reports she would like to be re-evaluated because she does not want to go inpatient at this time.  Provider notified. Pt reports poor sleep over the night even after receiving prn sleep aid. Breathing is even and unlabored.  Will continue to monitor for safety.

## 2021-02-24 NOTE — [facility] Group Notes (Signed)
Patient did not attend the relaxation group. 

## 2021-02-25 LAB — PROLACTIN: Prolactin: 5.9 ng/mL (ref 4.8–23.3)

## 2021-02-25 MED ORDER — TRAZODONE HCL 100 MG PO TABS
100.0000 mg | ORAL_TABLET | Freq: Every day | ORAL | Status: DC
  Administered 2021-02-25 – 2021-02-27 (×3): 100 mg via ORAL
  Filled 2021-02-25 (×6): qty 1

## 2021-02-25 MED ORDER — GABAPENTIN 100 MG PO CAPS
100.0000 mg | ORAL_CAPSULE | Freq: Three times a day (TID) | ORAL | Status: DC
  Administered 2021-02-25 – 2021-02-26 (×4): 100 mg via ORAL
  Filled 2021-02-25 (×10): qty 1

## 2021-02-25 MED ORDER — VORTIOXETINE HBR 5 MG PO TABS
5.0000 mg | ORAL_TABLET | Freq: Every day | ORAL | Status: DC
  Administered 2021-02-25: 5 mg via ORAL
  Filled 2021-02-25 (×4): qty 1

## 2021-02-25 MED ORDER — VORTIOXETINE HBR 10 MG PO TABS
10.0000 mg | ORAL_TABLET | Freq: Every day | ORAL | Status: DC
  Administered 2021-02-26 – 2021-02-28 (×3): 10 mg via ORAL
  Filled 2021-02-25 (×5): qty 1

## 2021-02-25 NOTE — Progress Notes (Signed)
°   02/25/21 2300  Psych Admission Type (Psych Patients Only)  Admission Status Voluntary  Psychosocial Assessment  Patient Complaints Anxiety  Eye Contact Fair  Facial Expression Anxious  Affect Appropriate to circumstance  Speech Logical/coherent  Interaction Assertive  Motor Activity Slow  Appearance/Hygiene Unremarkable  Behavior Characteristics Anxious  Mood Anxious  Aggressive Behavior  Effect No apparent injury  Thought Process  Coherency WDL  Content WDL  Delusions WDL  Perception WDL  Hallucination None reported or observed  Judgment Impaired  Confusion None  Danger to Self  Current suicidal ideation? Denies

## 2021-02-25 NOTE — [facility] Group Notes (Signed)
Goals Group 02/25/2021   Group Focus: affirmation, clarity of thought, and goals/reality orientation Treatment Modality:  Psychoeducation Interventions utilized were assignment, group exercise, and support Purpose: To be able to understand and verbalize the reason for their admission to the hospital. To understand that the medication helps with their chemical imbalance but they also need to work on their choices in life. To be challenged to develop a list of 30 positives about themselves. Also introduce the concept that "feelings" are not reality.  Participation Level:  Active  Participation Quality:  Appropriate  Affect:  Appropriate  Cognitive:  Appropriate  Insight:  Improving  Engagement in Group:  Engaged  Additional Comments:  10/10. Participated. Fully in the group.   Dione Housekeeper

## 2021-02-25 NOTE — [facility] Group Notes (Signed)
.  Psychoeducational Group Note    Date:02/25/21 Time: 1300-1400    Purpose of Group: . The group focus' on teaching patients on how to identify their needs and their Life Skills:  A group where two lists are made. What people need and what are things that we do that are unhealthy. The lists are developed by the patients and it is explained that we often do the actions that are not healthy to get our list of needs met.  Goal:: to develop the coping skills needed to get their needs met  Participation Level:  Active  Participation Quality:  Appropriate  Affect:  Appropriate  Cognitive:  Oriented  Insight:  Improving  Engagement in Group:  Engaged  Additional Comments: Rates herself as a 10/10. Participated fully in the group. Gave and received feedback.  Paulino Rily

## 2021-02-25 NOTE — Group Note (Signed)
LCSW Group Therapy ° ° °Due to high patient acuity and staffing, group was unable to be held on 02/25/2021. The CSW supervisor as well as the on-duty AC was made aware. ° °Yoland Scherr T Ollie Delano LCSWA  °2:44 PM ° °

## 2021-02-25 NOTE — Progress Notes (Signed)
[facility] Group Notes:  (Nursing/MHT/Case Management/Adjunct)  Date:  02/25/2021  Time:  2015  Type of Therapy:   wrap up group  Participation Level:  Active  Participation Quality:  Appropriate, Attentive, Sharing, and Supportive  Affect:  Appropriate  Cognitive:  Alert  Insight:  Improving  Engagement in Group:  Engaged  Modes of Intervention:  Clarification, Education, and Support  Summary of Progress/Problems: Positive thinking and positive change were discussed.   Marcille Buffy 02/25/2021, 8:55 PM

## 2021-02-25 NOTE — Progress Notes (Signed)
D:  Patient's self inventory sheet, patient has poor sleep, sleep medication is somewhat helpful.  Good appetite, normal energy level, good concentration.  Rated depression 3, denied hopeless, anxiety 4.  Withdrawals cravings.  Denied SI.  Physical problems..  Denied physical pain.  Goal is get a psychiatrist to talk to one-by-one.  Realize not everyone is judging.  Anxiety improved   I smile much more, grateful for life  There is no way I would not follow my plan. A:  Medications administered per MD orders.  Emotional support and encouragement given patient. R:  Denied SI and HI, contracts for safety.  Denied A/V hallucinations.  Safety maintained with 15 minute checks.
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
## 2021-02-25 NOTE — Progress Notes (Signed)
Medications applied to patient's back, etc.  Patient stated the rash is spreading under her arms, etc.  Patient said the rash does not itch.  No other symptoms.

## 2021-02-25 NOTE — Progress Notes (Signed)
Nurse discussed anxiety, depression and coping skills with patient.  

## 2021-02-26 MED ORDER — GABAPENTIN 100 MG PO CAPS
200.0000 mg | ORAL_CAPSULE | Freq: Three times a day (TID) | ORAL | Status: DC
  Administered 2021-02-26 – 2021-02-28 (×6): 200 mg via ORAL
  Filled 2021-02-26 (×12): qty 2

## 2021-02-26 NOTE — Progress Notes (Signed)
D:  Patient's self inventory sheet, patient has fair sleep, sleep medication helpful.  Fair appetite, normal energy level, good concentration.  Rated depression and anxiety 3, denied hopeless.  Denied withdrawals.  Denied SI.  Physical problems.  Rash is getting better.  Goal is stop rumunating in the past.  Plans to listen to group.  Does have discharge plans. A:  Medications administered per MD orders.  Emotional support and encouragement given patient. R:  Denied SI and HI, contracts for safety.  Denied A/V hallucinations.  Safety maintained with 15 minute checks.

## 2021-02-26 NOTE — Group Note (Signed)
LCSW Group Therapy Note ° ° °Group Date: 02/26/2021 °Start Time: 1000 °End Time: 1100 ° ° °Type of Therapy and Topic:  Group Therapy:  ° °LCSW Group Therapy °  °  °Due to high patient acuity and staffing, group was unable to be held on 02/26/2021. The CSW supervisor as well as the on-duty AC was made aware. ° ° ° °Marcia Cervone A Jarry Manon, LCSW °02/26/2021  12:30 PM   ° °

## 2021-02-26 NOTE — Progress Notes (Signed)
°   02/26/21 0500  Sleep  Number of Hours 6.5

## 2021-02-26 NOTE — Progress Notes (Signed)
[facility]  02/26/2021 2:05 PM Marcia Hill  MRN:  [id number]  Subjective:  Marcia Hill states: "I'm doing good. I woke up off balance. The sleep medicine made me have vivid dreams. They were nice ones though."  Daily Notes, 02/26/2021: Pt's chart reviewed, case discussed with the treatment team. Pt with a euthymic mood and affect is appropriate and congruent. Pt's attention to personal hygiene and grooming is fair, eye contact is good, speech is clear & coherent. Thought contents are organized and logical, and pt currently denies SI/HI/AVH or paranoia. There is no evidence of delusional thoughts.  Pt rates her anxiety today as 3 (10 being worst). She rates her depression as 2 (10 being worst), and reports a good appetite and good sleep quality last night. She reports having dreams that were vivid, but states that her sleep was not disrupted, and she does not want the medication changed. Pt is visible on the unit attending group sessions and interacting with her peers. Pt denies being in any physical pain/distress. Will continue Trintellix 10 mg daily, and increase Gabapentin to 200 mg TID to manage anxiety. Will continue Trazodone 100 mg nightly for insomnia, and will discontinue Vistaril as she has said this medication is not helpful.  Labs reviewed: CMP, Lipid profile, CBC, Hemoglobin A1C, TSH WNL. EKG on 02/23/21 with Sinus rhythm with marked sinus arrhythmia Rightward axis, RSR' or QR pattern in V1 suggests right ventricular conduction delay, Borderline ECG". EKG repeated today and shows: Sinus bradycardia with sinus arrhythmia Otherwise normal ECG. QTC is 415. Pt denies any history of cardiac disease, and has been educated to follow this up after discharge with her Primary care provider. HR is 122. Will monitor this. The rest of V/S are WNL.  History of Present Illness: Marcia Hill is a 21 y.o. female with a reported history of MDD & anxiety, [place] [facility] [place] [facility] on 02/23/21 with complaints of worsening depression and SI with a plan to overdose on pills. Pt was deemed to be a danger to herself and [facility] and stabilization of her mood.   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 No pertinent surgical history. Family History:  Family History  Problem Relation Age of Onset   ADD / ADHD Sister    Drug abuse Maternal Uncle    Depression Maternal Uncle    Dementia Paternal Grandmother    Family Psychiatric  History: As above Social History:  Social History   Substance and Sexual Activity  Alcohol Use Yes   Alcohol/week: 3.0 standard drinks   Types: 3 Glasses of wine per week     Social History   Substance and Sexual Activity  Drug Use Yes   Types: Benzodiazepines, Marijuana    Social History   Socioeconomic History   Marital status: Single    Spouse name: Not on file   Number of children: 0  Years of education: Not on file   Highest education level: Some college, no degree  Occupational History   Not on file  Tobacco Use   Smoking status: Never   Smokeless tobacco: Never  Vaping Use   Vaping Use: Every day  Substance and Sexual Activity   Alcohol use: Yes    Alcohol/week: 3.0 standard drinks    Types: 3 Glasses of wine per week   Drug use: Yes    Types: Benzodiazepines, Marijuana   Sexual activity: Not  Currently  Other Topics Concern   Not on file  Social History Narrative   Not on file   Social Determinants of Health   Financial Resource Strain: Not on file  Food Insecurity: Not on file  Transportation Needs: Not on file  Physical Activity: Not on file  Stress: Not on file  Social Connections: Not on file   Additional Social History:    Pt reports that she lives with her mother and mother's boyfriend who are both supportive. She reports that her younger sister lives in an apartment with her friend, but is also supportive. 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     Sleep: Good  Appetite:  Good  Current Medications: Current Facility-Administered Medications  Medication Dose Route Frequency Provider Last Rate Last Admin   acetaminophen (TYLENOL) tablet 650 mg  650 mg Oral Q6H PRN Lenard Lance, FNP       alum & mag hydroxide-simeth (MAALOX/MYLANTA) 200-200-20 MG/5ML suspension 30 mL  30 mL Oral Q4H PRN Lenard Lance, FNP       feeding supplement (ENSURE ENLIVE / ENSURE PLUS) liquid 237 mL  237 mL Oral BID BM Hill, Shelbie Hutching, MD   237 mL at 02/26/21 1037   gabapentin (NEURONTIN) capsule 200 mg  200 mg Oral TID Starleen Blue, NP       hydrocortisone cream 1 %   Topical TID Lauro Franklin, MD   Given at 02/26/21 9373   loperamide (IMODIUM) capsule 2-4 mg  2-4 mg Oral PRN Lenard Lance, FNP       LORazepam (ATIVAN) tablet 1 mg  1 mg Oral Q6H PRN Lenard Lance, FNP   1 mg at 02/24/21 2155   magnesium hydroxide (MILK OF MAGNESIA) suspension 30 mL  30 mL Oral Daily PRN Lenard Lance, FNP       multivitamin with minerals tablet 1 tablet  1 tablet Oral Daily Lenard Lance, FNP   1 tablet at 02/26/21 4287   nicotine polacrilex (NICORETTE) gum 2 mg  2 mg Oral PRN Roselle Locus, MD   2 mg at 02/26/21 0726   ondansetron (ZOFRAN-ODT) disintegrating tablet 4 mg  4 mg Oral Q6H PRN Lenard Lance, FNP       Melene Muller ON  02/27/2021] predniSONE (DELTASONE) tablet 20 mg  20 mg Oral Daily Hill, Shelbie Hutching, MD       Followed by   Melene Muller ON 02/28/2021] predniSONE (DELTASONE) tablet 10 mg  10 mg Oral Daily Hill, Shelbie Hutching, MD       thiamine tablet 100 mg  100 mg Oral Daily Lenard Lance, FNP   100 mg at 02/26/21 6811   traZODone (DESYREL) tablet 100 mg  100 mg Oral QHS Starleen Blue, NP   100 mg at 02/25/21 2125   vortioxetine HBr (TRINTELLIX) tablet 10 mg  10 mg Oral Daily Starleen Blue, NP  10 mg at 02/26/21 5462   Lab Results: No results found for this or any previous visit (from the past 48 hour(s)).  Blood Alcohol level:  Lab Results  Component Value Date   ETH <10 02/23/2021   Metabolic Disorder Labs: Lab Results  Component Value Date   HGBA1C 4.8 02/23/2021   MPG 91.06 02/23/2021   Lab Results  Component Value Date   PROLACTIN 5.9 02/23/2021   Lab Results  Component Value Date   CHOL 170 02/23/2021   TRIG 37 02/23/2021   HDL 84 02/23/2021   CHOLHDL 2.0 02/23/2021   VLDL 7 02/23/2021   LDLCALC 79 02/23/2021   Physical Findings: AIMS: Facial and Oral Movements Muscles of Facial Expression: None, normal Lips and Perioral Area: None, normal Jaw: None, normal Tongue: None, normal,Extremity Movements Upper (arms, wrists, hands, fingers): None, normal Lower (legs, knees, ankles, toes): None, normal, Trunk Movements Neck, shoulders, hips: None, normal, Overall Severity Severity of abnormal movements (highest score from questions above): None, normal Incapacitation due to abnormal movements: None, normal Patient's awareness of abnormal movements (rate only patient's report): No Awareness, Dental Status Current problems with teeth and/or dentures?: No Does patient usually wear dentures?: No  CIWA:  CIWA-Ar Total: 0 COWS:  n/a   Musculoskeletal: Strength & Muscle Tone: within normal limits Gait & Station: normal Patient leans: N/A  Psychiatric Specialty Exam:  Presentation   General Appearance: Appropriate for Environment; Fairly Groomed  Eye Contact:Good  Speech:Clear and Coherent  Speech Volume:Normal  Handedness:Right  Mood and Affect  Mood:Depressed  Affect:Congruent  Thought Process  Thought Processes:Coherent  Descriptions of Associations:Intact  Orientation:Full (Time, Place and Person)  Thought Content:Logical  History of Schizophrenia/Schizoaffective disorder:No  Duration of Psychotic Symptoms:No data recorded Hallucinations:Hallucinations: None  Ideas of Reference:None  Suicidal Thoughts:Suicidal Thoughts: No  Homicidal Thoughts:Homicidal Thoughts: No   Sensorium  Memory:Immediate Good  Judgment:Fair  Insight:Fair  Executive Functions  Concentration:Fair  Attention Span:Fair  Recall:Fair  Fund of Knowledge:Fair  Language:Fair  Psychomotor Activity  Psychomotor Activity:Psychomotor Activity: Normal  Assets  Assets:Communication Skills  Sleep  Sleep:Sleep: Fair  Physical Exam: Physical Exam Constitutional:      Appearance: Normal appearance.  HENT:     Head: Normocephalic.     Nose: No congestion or rhinorrhea.  Eyes:     Pupils: Pupils are equal, round, and reactive to light.  Cardiovascular:     Rate and Rhythm: Normal rate.  Pulmonary:     Effort: Pulmonary effort is normal. No respiratory distress.  Musculoskeletal:        General: Normal range of motion.     Cervical back: Normal range of motion. No rigidity.  Neurological:     Mental Status: She is alert and oriented to person, place, and time.   Review of Systems  Constitutional: Negative.  Negative for chills and fever.  HENT: Negative.  Negative for sore throat.   Eyes: Negative.   Respiratory: Negative.  Negative for cough, hemoptysis and stridor.   Cardiovascular: Negative.  Negative for chest pain.  Gastrointestinal: Negative.  Negative for heartburn and nausea.  Genitourinary: Negative.   Musculoskeletal: Negative.   Skin:  Negative.   Neurological: Negative.  Negative for dizziness, tingling and headaches.  Endo/Heme/Allergies: Negative.   Psychiatric/Behavioral:  Positive for depression and substance abuse. The patient is nervous/anxious and has insomnia.   Blood pressure 108/63, pulse 66, temperature 98 F (36.7 C), temperature source Oral, resp. rate 16, height 5\' 5"  (1.651 m), weight 58.1 kg, SpO2 99 %.  Body mass index is 21.3 kg/m.  Treatment Plan Summary: Daily contact with patient to assess and evaluate symptoms and progress in treatment and Medication management  PLAN Safety and Monitoring: Voluntary admission to inpatient psychiatric unit for safety, stabilization and treatment Daily contact with patient to assess and evaluate symptoms and progress in treatment Patient's case to be discussed in multi-disciplinary team meeting Observation Level : q15 minute checks Vital signs: q12 hours Precautions: suicide, elopement, and assault   Long Term Goal(s): Improvement in symptoms so as ready for discharge   Short Term Goals: Ability to identify changes in lifestyle to reduce recurrence of condition will improve, Ability to verbalize feelings will improve, Ability to disclose and discuss suicidal ideas, Ability to demonstrate self-control will improve, Ability to identify and develop effective coping behaviors will improve, Compliance with prescribed medications will improve, and Ability to identify triggers associated with substance abuse/mental health issues will improve   Physician Treatment Plan for Secondary Diagnosis:  Principal Problem:   MDD (major depressive disorder), recurrent episode, severe (HCC)   -Continue Trintellix 10 mg daily    Anxiety -Continue Hydroxyzine 50 mg every 6 hours PRN -Increase Gabapentin to 200 mg TID   Insomnia -Continue Trazodone 100 mg nightly    CIWA > 10 -Continue Ativan 1mg  tabs every 6 hours PRN   Skin Rash -Continue Prednisone as ordered on  MAR -Continue Hydrocortisone 1 % PRN for rash   Other PRNS -Continue Tylenol 650 mg every 6 hours PRN for mild pain -Continue Maalox 30 mg every 4 hrs PRN for indigestion -Continue Milk of Magnesia as needed every 6 hrs for constipation -Continue Zofran 4 mg Q 6 H PRN -Continue Imodium 2-4 mg PRN   Discharge Planning: Social work and case management to assist with discharge planning and identification of hospital follow-up needs prior to discharge Estimated LOS: 5-7 days Discharge Concerns: Need to establish a safety plan; Medication compliance and effectiveness Discharge Goals: Return home with outpatient referrals for mental health follow-up including medication management/psychotherapy  Starleen Blue, NP 02/26/2021, 2:05 PM

## 2021-02-26 NOTE — [facility] Group Notes (Signed)
Adult Psychoeducational Group Not Date:  02/26/2021 Time:  5621-3086 Group Topic/Focus: PROGRESSIVE RELAXATION. A group where deep breathing is taught and tensing and relaxation muscle groups is used. Imagery is used as well.  Pts are asked to imagine 3 pillars that hold them up when they are not able to hold themselves up.  Participation Level:  Active  Participation Quality:  Appropriate  Affect:  Appropriate  Cognitive:  Oriented  Insight: Improving  Engagement in Group:  Engaged  Modes of Intervention:  Activity, Discussion, Education, and Support  Additional Comments:  Rates her energy as a 10/10. Participated fully in the group.  Dione Housekeeper

## 2021-02-26 NOTE — [facility] Group Notes (Signed)
Pt attended goals group and participated in discussion. 

## 2021-02-26 NOTE — Plan of Care (Signed)
Nurse discussed coping skills and anxiety with patient.  

## 2021-02-26 NOTE — [facility] Group Notes (Signed)
Adult Psychoeducational Group Not Date:  02/26/2021 Time:  9518-8416 Group Topic/Focus: PROGRESSIVE RELAXATION. A group where deep breathing is taught and tensing and relaxation muscle groups is used. Imagery is used as well.  Pts are asked to imagine 3 pillars that hold them up when they are not able to hold themselves up.  Participation Level:  Active  Participation Quality:  Appropriate  Affect:  Appropriate  Cognitive:  Oriented  Insight: Improving  Engagement in Group:  Engaged  Modes of Intervention:  Activity, Discussion, Education, and Support  Additional Comments:  rates her energy at a 10/10. States her family, her cat and the fact that she wants to travel.  Dione Housekeeper
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
## 2021-02-27 ENCOUNTER — Encounter (HOSPITAL_COMMUNITY): Payer: Self-pay

## 2021-02-27 DIAGNOSIS — F332 Major depressive disorder, recurrent severe without psychotic features: Principal | ICD-10-CM

## 2021-02-27 NOTE — BH IP Treatment Plan (Signed)
Interdisciplinary Treatment and Diagnostic Plan Update  02/27/2021 Time of Session: 9:15am Marcia Hill MRN: [id number]  Principal Diagnosis: MDD (major depressive disorder), recurrent episode, severe (HCC)  Secondary Diagnoses: Principal Problem:   MDD (major depressive disorder), recurrent episode, severe (HCC)   Current Medications:  Current Facility-Administered Medications  Medication Dose Route Frequency Provider Last Rate Last Admin   acetaminophen (TYLENOL) tablet 650 mg  650 mg Oral Q6H PRN Lenard Lance, FNP       alum & mag hydroxide-simeth (MAALOX/MYLANTA) 200-200-20 MG/5ML suspension 30 mL  30 mL Oral Q4H PRN Lenard Lance, FNP       feeding supplement (ENSURE ENLIVE / ENSURE PLUS) liquid 237 mL  237 mL Oral BID BM Hill, Shelbie Hutching, MD   237 mL at 02/27/21 0900   gabapentin (NEURONTIN) capsule 200 mg  200 mg Oral TID Starleen Blue, NP   200 mg at 02/27/21 0805   hydrocortisone cream 1 %   Topical TID Lauro Franklin, MD   Given at 02/26/21 0539   loperamide (IMODIUM) capsule 2-4 mg  2-4 mg Oral PRN Lenard Lance, FNP       LORazepam (ATIVAN) tablet 1 mg  1 mg Oral Q6H PRN Lenard Lance, FNP   1 mg at 02/24/21 2155   magnesium hydroxide (MILK OF MAGNESIA) suspension 30 mL  30 mL Oral Daily PRN Lenard Lance, FNP       multivitamin with minerals tablet 1 tablet  1 tablet Oral Daily Lenard Lance, FNP   1 tablet at 02/27/21 7673   nicotine polacrilex (NICORETTE) gum 2 mg  2 mg Oral PRN Roselle Locus, MD   2 mg at 02/26/21 0726   ondansetron (ZOFRAN-ODT) disintegrating tablet 4 mg  4 mg Oral Q6H PRN Lenard Lance, FNP   4 mg at 02/27/21 0901   [START ON 02/28/2021] predniSONE (DELTASONE) tablet 10 mg  10 mg Oral Daily Hill, Shelbie Hutching, MD       thiamine tablet 100 mg  100 mg Oral Daily Lenard Lance, FNP   100 mg at 02/27/21 4193   traZODone (DESYREL) tablet 100 mg  100 mg Oral QHS Starleen Blue, NP   100 mg at 02/26/21 2141   vortioxetine HBr  (TRINTELLIX) tablet 10 mg  10 mg Oral Daily Starleen Blue, NP   10 mg at 02/27/21 0805   PTA Medications: Medications Prior to Admission  Medication Sig Dispense Refill Last Dose   albuterol (VENTOLIN HFA) 108 (90 Base) MCG/ACT inhaler Inhale 1-2 puffs into the lungs every 4 (four) hours as needed for shortness of breath.      diphenhydrAMINE (BENADRYL) 25 MG tablet Take 50 mg by mouth every 6 (six) hours as needed for itching.      hydrOXYzine (ATARAX) 25 MG tablet TAKE 1 TABLET BY MOUTH TWICE DAILY AS NEEDED FOR ANXIETY (Patient taking differently: Take 25 mg by mouth 2 (two) times daily as needed for anxiety.) 60 tablet 1    predniSONE (STERAPRED UNI-PAK 21 TAB) 10 MG (21) TBPK tablet Take by mouth daily. As directed 21 tablet 0     Patient Stressors: Loss of relationship   Occupational concerns   Substance abuse   Traumatic event    Patient Strengths: Active sense of humor  Capable of independent living  Communication skills  Financial means  Work skills   Treatment Modalities: Medication Management, Group therapy, Case management,  1 to 1 session with clinician, Psychoeducation, Recreational therapy.  Physician Treatment Plan for Primary Diagnosis: MDD (major depressive disorder), recurrent episode, severe (HCC) Long Term Goal(s): Improvement in symptoms so as ready for discharge   Short Term Goals: Ability to identify changes in lifestyle to reduce recurrence of condition will improve Ability to verbalize feelings will improve Ability to disclose and discuss suicidal ideas Ability to demonstrate self-control will improve Ability to identify and develop effective coping behaviors will improve Compliance with prescribed medications will improve Ability to identify triggers associated with substance abuse/mental health issues will improve  Medication Management: Evaluate patient's response, side effects, and tolerance of medication regimen.  Therapeutic Interventions: 1 to  1 sessions, Unit Group sessions and Medication administration.  Evaluation of Outcomes: Progressing  Physician Treatment Plan for Secondary Diagnosis: Principal Problem:   MDD (major depressive disorder), recurrent episode, severe (HCC)  Long Term Goal(s): Improvement in symptoms so as ready for discharge   Short Term Goals: Ability to identify changes in lifestyle to reduce recurrence of condition will improve Ability to verbalize feelings will improve Ability to disclose and discuss suicidal ideas Ability to demonstrate self-control will improve Ability to identify and develop effective coping behaviors will improve Compliance with prescribed medications will improve Ability to identify triggers associated with substance abuse/mental health issues will improve     Medication Management: Evaluate patient's response, side effects, and tolerance of medication regimen.  Therapeutic Interventions: 1 to 1 sessions, Unit Group sessions and Medication administration.  Evaluation of Outcomes: Progressing   RN Treatment Plan for Primary Diagnosis: MDD (major depressive disorder), recurrent episode, severe (HCC) Long Term Goal(s): Knowledge of disease and therapeutic regimen to maintain health will improve  Short Term Goals: Ability to remain free from injury will improve, Ability to verbalize frustration and anger appropriately will improve, Ability to demonstrate self-control, Ability to participate in decision making will improve, and Compliance with prescribed medications will improve  Medication Management: RN will administer medications as ordered by provider, will assess and evaluate patient's response and provide education to patient for prescribed medication. RN will report any adverse and/or side effects to prescribing provider.  Therapeutic Interventions: 1 on 1 counseling sessions, Psychoeducation, Medication administration, Evaluate responses to treatment, Monitor vital signs and  CBGs as ordered, Perform/monitor CIWA, COWS, AIMS and Fall Risk screenings as ordered, Perform wound care treatments as ordered.  Evaluation of Outcomes: Progressing   LCSW Treatment Plan for Primary Diagnosis: MDD (major depressive disorder), recurrent episode, severe (HCC) Long Term Goal(s): Safe transition to appropriate next level of care at discharge, Engage patient in therapeutic group addressing interpersonal concerns.  Short Term Goals: Engage patient in aftercare planning with referrals and resources, Increase social support, Increase ability to appropriately verbalize feelings, Identify triggers associated with mental health/substance abuse issues, and Increase skills for wellness and recovery  Therapeutic Interventions: Assess for all discharge needs, 1 to 1 time with Social worker, Explore available resources and support systems, Assess for adequacy in community support network, Educate family and significant other(s) on suicide prevention, Complete Psychosocial Assessment, Interpersonal group therapy.  Evaluation of Outcomes: Progressing   Progress in Treatment: Attending groups: Yes. Participating in groups: Yes. Taking medication as prescribed: Yes. Toleration medication: Yes. Family/Significant other contact made: No, will contact:  mother Patient understands diagnosis: Yes. Discussing patient identified problems/goals with staff: Yes. Medical problems stabilized or resolved: Yes. Denies suicidal/homicidal ideation: Yes. Issues/concerns per patient self-inventory: No.   New problem(s) identified: No, Describe:  none  New Short Term/Long Term Goal(s): detox, medication management for mood stabilization; elimination  of SI thoughts; development of comprehensive mental wellness/sobriety plan  Patient Goals:  "To not care what others think and to stop ruminating in the past"  Discharge Plan or Barriers: Patient is to return to live with mother at discharge. Patient is to  be set up with therapy and medication management  Reason for Continuation of Hospitalization: Medication stabilization  Estimated Length of Stay: 1-3 days   Scribe for Treatment Team: Otelia Santee, LCSW 02/27/2021 10:36 AM

## 2021-02-27 NOTE — [facility] Suicide Risk Assessment (Signed)
[facility] INPATIENT:  Family/Significant Other Suicide Prevention Education  Suicide Prevention Education:  Education Completed; Marcia Hill,  [phone]) (name of family member/significant other) has been identified by the patient as the family member/significant other with whom the patient will be residing, and identified as the person(s) who will aid the patient in the event of a mental health crisis (suicidal ideations/suicide attempt).  With written consent from the patient, the family member/significant other has been provided the following suicide prevention education, prior to the and/or following the discharge of the patient.  CSW spoke with patient mother who reports that patient has a history of anxiety and depression.  This is her first hospitalization.  Patient went through a bad break up, recently increased alcohol use.  Mother reports that she hasn't always followed through with therapy and medications.  CSW provided follow up hospital appts.  Mother reports no access to guns/weapons and that they would try to secure medications and be more involved with medication taking.  Mother can pick patient up at 3:30pm/4pm and if patient agrees would appreciate for someone to review discharge paperwork with her. No other safety concerns.   The suicide prevention education provided includes the following: Suicide risk factors Suicide prevention and interventions National Suicide Hotline telephone number [facility] assessment telephone number [facility] Emergency Assistance [place] and/or Residential Mobile Crisis Unit telephone number  Request made of family/significant other to: Remove weapons (Marcia.g., guns, rifles, knives), all items previously/currently identified as safety concern.   Remove drugs/medications (over-the-counter, prescriptions, illicit drugs), all items previously/currently identified as a safety concern.  The family member/significant other  verbalizes understanding of the suicide prevention education information provided.  The family member/significant other agrees to remove the items of safety concern listed above.  Marcia Hill Marcia Hill 02/27/2021, 4:13 PM

## 2021-02-27 NOTE — Progress Notes (Signed)
D:  Patient's self inventory sheet, patient has fair sleep, sleep medication helpful.  Good appetite, normal energy level, good concentration.  Rated depression 2, denied hopeless, anxiety 3.  Denied withdrawals.  Denied SI.  Denied physical problems.  Denied physical pain.  Goal is to prepare for the real world and prepare myself for a better relationship.  Plans to attend groups and ask questions.  I feel like I'm prepared and have the tools to go back into my new life. A:  Medications administered per MD orders.  Emotional support and encouragement given patient. R:  Denied SI and HI, contracts for safety.  Denied A/V hallucinations.  Safety maintained with 15 minute checks.

## 2021-02-27 NOTE — Group Note (Signed)
LCSW Group Therapy Note  Group Date: 02/27/2021 Start Time: 1300 End Time: 1400   Type of Therapy and Topic:  Group Therapy: Positive Affirmations  Participation Level:  Minimal   Description of Group:   This group addressed positive affirmation towards self and others.  Patients went around the room and identified two positive things about themselves and two positive things about a peer in the room.  Patients reflected on how it felt to share something positive with others, to identify positive things about themselves, and to hear positive things from others/ Patients were encouraged to have a daily reflection of positive characteristics or circumstances.   Therapeutic Goals: Patients will verbalize two of their positive qualities Patients will demonstrate empathy for others by stating two positive qualities about a peer in the group Patients will verbalize their feelings when voicing positive self affirmations and when voicing positive affirmations of others Patients will discuss the potential positive impact on their wellness/recovery of focusing on positive traits of self and others.  Summary of Patient Progress: Pt came to group but was pulled out.  Therapeutic Modalities:   Cognitive Behavioral Therapy Motivational Interviewing    Felizardo Hoffmann, Theresia Majors 02/27/2021  1:28 PM

## 2021-02-27 NOTE — Progress Notes (Addendum)
Pt presents with bright affect and happy mood.  Pt is motivated for discharge and says she will make sure she goes to her follow up appts post discharge.  Pt is smiling and interacting with pt's on the unit.  VSS.  Pt in no acute distress at this time. Denies SI/HI/AVH.

## 2021-02-27 NOTE — Group Note (Deleted)
LCSW Group Therapy Note ° ° °Group Date: 02/27/2021 °Start Time: 1300 °End Time: 1400 ° ° °Type of Therapy and Topic:  Group Therapy:  ° °Participation Level:  {[facility] PARTICIPATION LEVEL:22264} ° °Description of Group: ° ° °Therapeutic Goals: ° °1.   ° ° °Summary of Patient Progress:   ° °*** ° °Therapeutic Modalities:  ° °Breanna Shorkey M Cyann Venti, LCSWA °02/27/2021  1:24 PM   ° °

## 2021-02-27 NOTE — Progress Notes (Signed)
°   02/27/21 2100  Psych Admission Type (Psych Patients Only)  Admission Status Voluntary  Psychosocial Assessment  Patient Complaints Anxiety  Eye Contact Fair  Facial Expression Anxious  Affect Appropriate to circumstance  Speech Logical/coherent  Interaction Assertive  Motor Activity Slow  Appearance/Hygiene Unremarkable  Behavior Characteristics Anxious  Mood Pleasant  Aggressive Behavior  Effect No apparent injury  Thought Process  Coherency WDL  Content WDL  Delusions WDL  Perception WDL  Hallucination None reported or observed  Judgment WDL  Confusion WDL  Danger to Self  Current suicidal ideation? Denies  Danger to Others  Danger to Others None reported or observed

## 2021-02-27 NOTE — Group Note (Signed)
Recreation Therapy Group Note   Group Topic:Stress Management  Group Date: 02/27/2021 Start Time: 0930 End Time: 0950 Facilitators: Caroll Rancher, [place] Location: [address] Dayroom   Goal Area(s) Addresses:  Patient will identify positive stress management techniques. Patient will identify benefits of using stress management post d/c.  Group Description:  Stress Release.  LRT played a meditation that focused on releasing stress through breathing and muscle tension and release.  Patients were to follow along as meditation played and go through the steps as the speaker was leading them through.  LRT and patients also discussed places like apps and Youtube  to access other stress management techniques.   Affect/Mood: Appropriate   Participation Level: Active   Participation Quality: Independent   Behavior: Appropriate   Speech/Thought Process: Focused   Insight: Good   Judgement: Good   Modes of Intervention: Meditation   Patient Response to Interventions:  Attentive   Education Outcome:  Acknowledges education and In group clarification offered    Clinical Observations/Individualized Feedback: Pt attended and participated in group.    Plan: Continue to engage patient in RT group sessions 2-3x/week.   Caroll Rancher, Antonietta Jewel 02/27/2021 12:53 PM

## 2021-02-27 NOTE — Progress Notes (Signed)
NUTRITION ASSESSMENT  Pt identified as at risk on the Malnutrition Screen Tool  INTERVENTION: 1. Supplements: Ensure Plus High Protein po BID, each supplement provides 350 kcal and 20 grams of protein.   NUTRITION DIAGNOSIS: Unintentional weight loss related to sub-optimal intake as evidenced by pt report.   Goal: Pt to meet >/= 90% of their estimated nutrition needs.  Monitor:  PO intake  Assessment:  Pt admitted with depression. PTA pt had a 3 day alcohol binge. Pt with reported good appetite. Per weight records, pt has lost 38 lbs over 1 year (23% wt loss x 1 year, significant for time frame).  Ensure supplements have been ordered.   Height: Ht Readings from Last 1 Encounters:  02/24/21 5\' 5"  (1.651 m)    Weight: Wt Readings from Last 1 Encounters:  02/24/21 58.1 kg    Weight Hx: Wt Readings from Last 10 Encounters:  02/24/21 58.1 kg  11/07/20 58.5 kg  02/23/20 75.3 kg (90 %, Z= 1.30)*  11/17/19 71.7 kg (87 %, Z= 1.12)*  08/04/19 70.4 kg (86 %, Z= 1.07)*  12/12/18 65.8 kg (79 %, Z= 0.82)*  01/15/18 71.2 kg (89 %, Z= 1.24)*  01/10/18 72.2 kg (90 %, Z= 1.29)*  11/15/17 75.8 kg (93 %, Z= 1.47)*  09/26/17 76.7 kg (94 %, Z= 1.52)*   * Growth percentiles are based on CDC (Girls, 2-20 Years) data.    BMI:  Body mass index is 21.3 kg/m. Pt meets criteria for normal based on current BMI.  Estimated Nutritional Needs: Kcal: 25-30 kcal/kg Protein: > 1 gram protein/kg Fluid: 1 ml/kcal  Diet Order:  Diet Order             Diet regular Room service appropriate? Yes; Fluid consistency: Thin  Diet effective now                  Pt is also offered choice of unit snacks mid-morning and mid-afternoon.  Pt is eating as desired.   Lab results and medications reviewed.   Clayton Bibles, MS, RD, LDN Inpatient Clinical Dietitian Contact information available via Amion

## 2021-02-27 NOTE — Plan of Care (Signed)
°  Problem: Coping: Goal: Ability to interact with others will improve Outcome: Progressing   Problem: Education: Goal: Emotional status will improve Outcome: Progressing Goal: Mental status will improve Outcome: Progressing

## 2021-02-27 NOTE — Progress Notes (Signed)
°   02/26/21 2141  Psych Admission Type (Psych Patients Only)  Admission Status Voluntary  Psychosocial Assessment  Patient Complaints Anxiety  Eye Contact Fair  Facial Expression Anxious  Affect Appropriate to circumstance  Speech Logical/coherent  Interaction Assertive  Motor Activity Other (Comment) (Steady)  Appearance/Hygiene Unremarkable  Behavior Characteristics Anxious  Mood Pleasant  Thought Process  Coherency WDL  Content WDL  Delusions None reported or observed  Perception WDL  Hallucination None reported or observed  Judgment Impaired  Confusion None  Danger to Self  Current suicidal ideation? Denies  Danger to Others  Danger to Others None reported or observed

## 2021-02-27 NOTE — Progress Notes (Signed)
The patient attended the evening A.A.meeting and was appropriate.  

## 2021-02-27 NOTE — [facility] Group Notes (Signed)
Orientation Group and Psychoeducational teaching. Patients were asked to share how they are feeling mentally today, and unit and ward rules, expectations were discussed. Psychoeducational teaching was then discussed with habits of mindfulness and positive reframing techniques to improve mental health. The patients were given a poem of the [place] and asked one technique to use to help calm anxiety and negative thoughts. The patient did not attend group.

## 2021-02-27 NOTE — Progress Notes (Signed)
[facility] MD Progress Note  02/27/2021 4:01 PM Marcia Hill  MRN:  [id number]  Subjective: "I'm hopeful and excited because of the treatment and therapy I am receiving here that calm and help me to focus."  Today's Note 02/27/21: Marcia Hill is examined in her room in 300 hall sitting on her bed. Chart is reviewed and findings are shared with the treatment team and discussed with Dr. Loleta Chance. On evaluation today, patient is alert and oriented  x 4 and appropriately dressed for the weather. She answered all questions appropriately and participated effectively during the encounter. Her speech is clear, coherent with normal rate and volume. Mood is euthymic. Thought process is coherent and goal directed. No ideas of reference exhibited.   Patient reported sleeping at least 8 hours last night. Her appetite is good and consuming 100% of her meals. Added, "I like the treatment here because I had eating disorder while at home, but here I am eating well and taking vitamins and ensure and I really feel good." Reported anxiety as "2", and depression  as "0" on a scale of 0 to 10. Denied SI/HI/AVH, thought injection, ideas of reference or thought deletion. Actively attending group sessions and learning coping skills on how to decrease anxiety with deep slow breathing, and asking friends and family for help as needed rather than being stressed out.  Brief History:   Marcia Hill is a 21 y.o. female with a reported history of MDD & anxiety, who presented to the [place] [facility] [place] [facility]) on 02/23/21 with complaints of worsening depression and SI with a plan to overdose on pills. Pt was deemed to be a danger to herself and voluntarily transferred to this [facility] for treatment and stabilization of her mood.   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 No pertinent surgical history. Family History:  Family History  Problem Relation Age of Onset   ADD / ADHD Sister    Drug abuse Maternal Uncle    Depression Maternal Uncle    Dementia Paternal Grandmother    Family Psychiatric  History: As indicated above  Social History:  Social History   Substance and Sexual Activity  Alcohol Use Yes   Alcohol/week: 3.0 standard drinks   Types: 3 Glasses of wine per week     Social History   Substance and Sexual Activity  Drug Use Yes   Types: Benzodiazepines, Marijuana    Social History   Socioeconomic History   Marital status: Single    Spouse name: Not on file   Number of children: 0   Years of education: Not on file   Highest education level: Some college, no degree  Occupational History   Not on file  Tobacco Use   Smoking status: Never   Smokeless tobacco: Never  Vaping Use   Vaping Use: Every day  Substance and Sexual Activity   Alcohol use: Yes    Alcohol/week: 3.0 standard drinks  Types: 3 Glasses of wine per week   Drug use: Yes    Types: Benzodiazepines, Marijuana   Sexual activity: Not Currently  Other Topics Concern   Not on file  Social History Narrative   Not on file   Social Determinants of Health   Financial Resource Strain: Not on file  Food Insecurity: Not on file  Transportation Needs: Not on file  Physical Activity: Not on file  Stress: Not on file  Social Connections: Not on file   Additional Social History:    Sleep: Good  Appetite:  Good  Current Medications: Current  Facility-Administered Medications  Medication Dose Route Frequency Provider Last Rate Last Admin   acetaminophen (TYLENOL) tablet 650 mg  650 mg Oral Q6H PRN Lenard Lance, FNP       alum & mag hydroxide-simeth (MAALOX/MYLANTA) 200-200-20 MG/5ML suspension 30 mL  30 mL Oral Q4H PRN Lenard Lance, FNP       feeding supplement (ENSURE ENLIVE / ENSURE PLUS) liquid 237 mL  237 mL Oral BID BM Hill, Shelbie Hutching, MD   237 mL at 02/27/21 1423   gabapentin (NEURONTIN) capsule 200 mg  200 mg Oral TID Starleen Blue, NP   200 mg at 02/27/21 1153   hydrocortisone cream 1 %   Topical TID Lauro Franklin, MD   Given at 02/26/21 8588   magnesium hydroxide (MILK OF MAGNESIA) suspension 30 mL  30 mL Oral Daily PRN Lenard Lance, FNP       multivitamin with minerals tablet 1 tablet  1 tablet Oral Daily Lenard Lance, FNP   1 tablet at 02/27/21 5027   nicotine polacrilex (NICORETTE) gum 2 mg  2 mg Oral PRN Roselle Locus, MD   2 mg at 02/26/21 0726   [START ON 02/28/2021] predniSONE (DELTASONE) tablet 10 mg  10 mg Oral Daily Hill, Shelbie Hutching, MD       thiamine tablet 100 mg  100 mg Oral Daily Lenard Lance, FNP   100 mg at 02/27/21 7412   traZODone (DESYREL) tablet 100 mg  100 mg Oral QHS Nkwenti, Doris, NP   100 mg at 02/26/21 2141   vortioxetine HBr (TRINTELLIX) tablet 10 mg  10 mg Oral Daily Starleen Blue, NP   10 mg at 02/27/21 8786   Lab Results: No results found for this or any previous visit (from the past 48 hour(s)).  Blood Alcohol level:  Lab Results  Component Value Date   ETH <10 02/23/2021    Metabolic Disorder Labs: Lab Results  Component Value Date   HGBA1C 4.8 02/23/2021   MPG 91.06 02/23/2021   Lab Results  Component Value Date   PROLACTIN 5.9 02/23/2021   Lab Results  Component Value Date   CHOL 170 02/23/2021   TRIG 37 02/23/2021   HDL 84 02/23/2021   CHOLHDL 2.0 02/23/2021   VLDL 7 02/23/2021   LDLCALC 79 02/23/2021    Physical Findings: AIMS: Facial  and Oral Movements Muscles of Facial Expression: None, normal Lips and Perioral Area: None, normal Jaw: None, normal Tongue: None, normal,Extremity Movements Upper (arms, wrists, hands, fingers): None, normal Lower (legs, knees, ankles, toes): None, normal, Trunk Movements Neck, shoulders, hips: None, normal, Overall Severity Severity of abnormal movements (highest score from questions above): None, normal Incapacitation due to abnormal movements: None, normal Patient's awareness of abnormal movements (rate only patient's report): No Awareness, Dental Status Current problems with teeth and/or dentures?: No Does patient usually wear dentures?: No  CIWA:  CIWA-Ar Total: 1 COWS:     Musculoskeletal: Strength & Muscle Tone: within normal limits  Gait & Station: normal  Patient leans: N/A  Psychiatric Specialty Exam:  Presentation  General Appearance: Appropriate for Environment; Casual; Fairly Groomed  Eye Contact:Good  Speech:Clear and Coherent; Normal Rate  Speech Volume:Normal  Handedness:Right  Mood and Affect  Mood:Euthymic  Affect:Congruent  Thought Process  Thought Processes:Coherent; Goal Directed  Descriptions of Associations:Intact  Orientation:Full (Time, Place and Person)  Thought Content:Logical; WDL  History of Schizophrenia/Schizoaffective disorder:No  Duration of Psychotic Symptoms:No data recorded Hallucinations:Hallucinations: None  Ideas of Reference:None  Suicidal Thoughts:Suicidal Thoughts: No  Homicidal Thoughts:Homicidal Thoughts: No  Sensorium  Memory:Immediate Good; Remote Good; Recent Good  Judgment:Fair  Insight:Fair  Executive Functions  Concentration:Good  Attention Span:Good  Recall:Good  Fund of Knowledge:Fair  Language:Good  Psychomotor Activity  Psychomotor Activity:Psychomotor Activity: Normal  Assets  Assets:Communication Skills; Desire for Improvement; Physical Health; Social Support  Sleep   Sleep:Sleep: Good Number of Hours of Sleep: 10  Physical Exam: Physical Exam Vitals and nursing note reviewed.  Constitutional:      Appearance: Normal appearance.  HENT:     Head: Normocephalic and atraumatic.     Right Ear: External ear normal.     Left Ear: External ear normal.     Nose: Nose normal.     Mouth/Throat:     Mouth: Mucous membranes are moist.  Eyes:     Extraocular Movements: Extraocular movements intact.     Conjunctiva/sclera: Conjunctivae normal.     Pupils: Pupils are equal, round, and reactive to light.  Cardiovascular:     Rate and Rhythm: Normal rate.     Pulses: Normal pulses.  Pulmonary:     Effort: Pulmonary effort is normal.  Abdominal:     Palpations: Abdomen is soft.  Genitourinary:    Comments: Deferred Musculoskeletal:        General: Normal range of motion.     Cervical back: Normal range of motion and neck supple.  Skin:    General: Skin is warm.  Neurological:     General: No focal deficit present.     Mental Status: She is alert and oriented to person, place, and time.  Psychiatric:        Mood and Affect: Mood normal.        Behavior: Behavior normal.        Thought Content: Thought content normal.   Review of Systems  Constitutional: Negative.  Negative for diaphoresis and fever.  HENT: Negative.  Negative for ear discharge, ear pain, hearing loss, sinus pain, sore throat and tinnitus.   Eyes: Negative.  Negative for blurred vision, double vision, photophobia, pain, discharge and redness.  Respiratory: Negative.  Negative for sputum production, shortness of breath, wheezing and stridor.   Cardiovascular: Negative.  Negative for chest pain and palpitations.  Gastrointestinal: Negative.  Negative for abdominal pain, constipation, diarrhea, heartburn, nausea and vomiting.  Genitourinary: Negative.  Negative for dysuria, frequency and urgency.  Musculoskeletal: Negative.  Negative for back pain and joint pain.  Skin: Negative.    Neurological:  Negative for dizziness, tingling, tremors, sensory change, speech change, focal weakness, seizures, loss of consciousness, weakness and headaches.  Endo/Heme/Allergies: Negative.   Psychiatric/Behavioral:  Positive for depression and suicidal ideas. The patient is nervous/anxious.   Blood pressure 109/79, pulse 80, temperature 97.7 F (36.5 C), resp. rate 18, height 5\' 5"  (1.651 m), weight 58.1 kg, SpO2 100 %. Body mass index is  21.3 kg/m.  Treatment Plan Summary: Daily contact with patient to assess and evaluate symptoms and progress in treatment and Medication management   No change in Treatment Plan, continue as below: PLAN Safety and Monitoring: Voluntary admission to inpatient psychiatric unit for safety, stabilization and treatment Daily contact with patient to assess and evaluate symptoms and progress in treatment Patient's case to be discussed in multi-disciplinary team meeting Observation Level : q15 minute checks Vital signs: q12 hours Precautions: suicide, elopement, and assault   Long Term Goal(s): Improvement in symptoms so as ready for discharge   Short Term Goals: Ability to identify changes in lifestyle to reduce recurrence of condition will improve, Ability to verbalize feelings will improve, Ability to disclose and discuss suicidal ideas, Ability to demonstrate self-control will improve, Ability to identify and develop effective coping behaviors will improve, Compliance with prescribed medications will improve, and Ability to identify triggers associated with substance abuse/mental health issues will improve   Physician Treatment Plan for Secondary Diagnosis:  Principal Problem:   MDD (major depressive disorder), recurrent episode, severe (HCC)   -Continue Trintellix 10 mg daily    Anxiety -Continue Hydroxyzine 50 mg every 6 hours PRN -Increase Gabapentin to 200 mg TID   Insomnia -Continue Trazodone 100 mg nightly    CIWA > 10 -Continue Ativan  1mg  tabs every 6 hours PRN   Skin Rash -Continue Prednisone as ordered on MAR -Continue Hydrocortisone 1 % PRN for rash   Other PRNS -Continue Tylenol 650 mg every 6 hours PRN for mild pain -Continue Maalox 30 mg every 4 hrs PRN for indigestion -Continue Milk of Magnesia as needed every 6 hrs for constipation -Continue Zofran 4 mg Q 6 H PRN -Continue Imodium 2-4 mg PRN   Discharge Planning: Social work and case management to assist with discharge planning and identification of hospital follow-up needs prior to discharge Estimated LOS: 5-7 days Discharge Concerns: Need to establish a safety plan; Medication compliance and effectiveness Discharge Goals: Return home with outpatient referrals for mental health follow-up including medication management/psychotherapy    Cecilie Lowers, FNP 02/27/2021, 4:01 PM

## 2021-02-28 MED ORDER — VORTIOXETINE HBR 10 MG PO TABS: 10.0000 mg | ORAL_TABLET | Freq: Every day | ORAL | 0 refills | Status: DC

## 2021-02-28 MED ORDER — TRAZODONE HCL 100 MG PO TABS: 100.0000 mg | ORAL_TABLET | Freq: Every day | ORAL | 0 refills | Status: DC

## 2021-02-28 MED ORDER — GABAPENTIN 100 MG PO CAPS: 200.0000 mg | ORAL_CAPSULE | Freq: Three times a day (TID) | ORAL | 0 refills | Status: DC

## 2021-02-28 MED ORDER — NICOTINE POLACRILEX 2 MG MT GUM: 2.0000 mg | CHEWING_GUM | OROMUCOSAL | 0 refills | Status: DC

## 2021-02-28 MED ORDER — HYDROCORTISONE 1 % EX CREA: TOPICAL_CREAM | Freq: Three times a day (TID) | CUTANEOUS | 0 refills | Status: AC

## 2021-02-28 NOTE — Progress Notes (Signed)
°   02/28/21 0500  Sleep  Number of Hours 6.5

## 2021-02-28 NOTE — Progress Notes (Signed)
Discharge note:  RN met with pt and reviewed pt's discharge instructions. Pt verbalized understanding of discharge instructions and pt did not have any questions. RN reviewed and provided pt with a copy of SRA, AVS and Transition Record. RN returned pt's belongings to pt. Pt stated that she was ready and excited to leave. Pt denied SI/HI/AVH and voiced no concerns. Pt was appreciative of the care pt received at [facility]. Patient discharged to the lobby without incident.

## 2021-02-28 NOTE — Progress Notes (Signed)
°  [facility] Adult Case Management Discharge Plan :  Will you be returning to the same living situation after discharge:  Yes,  living with mother.  At discharge, do you have transportation home?: Yes,  mother will pick patient up Do you have the ability to pay for your medications: Yes,  insurance  Release of information consent forms completed and in the chart;  Patient's signature needed at discharge.  Patient to Follow up at:  Follow-up Information     [facility] Follow up on 03/21/2021.   Why: You have an appointment for medication management services on  03/21/21 at 3:10 pm.   This appointment will be Virtual. Contact information: [address] [place] [address] [phone]         BEHAVIORAL HEALTH OUTPATIENT THERAPY [facility] Follow up on 03/20/2021.   Specialty: Behavioral Health Why: You have an appointment for therapy services on 03/20/21 at 9:00 am.  This will be a Virtual appointment.  (You are on a cancellation list for a sooner appointment). Contact information: [address] [id number] mc [place] [address] [phone]                Next level of care provider has access to [facility] Link:yes  Safety Planning and Suicide Prevention discussed: Yes,  mother, Shawna Orleans     Has patient been referred to the Quitline?: Patient refused referral. Patient reports that she vapes but has been able to quit cold [place]  Patient has been referred for addiction treatment: N/A  Marcus Groll E Valdemar Mcclenahan, LCSW 02/28/2021, 9:43 AM
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
## 2021-02-28 NOTE — Discharge Summary (Signed)
Physician Discharge Summary Note  Patient:  Marcia Hill is an 21 y.o., female MRN:  [id number] DOB:  Jan 09, 2000 Patient phone:  [phone] (home)  Patient address:   [address] [place] [address],  Total Time spent with patient: 30 minutes  Date of Admission:  02/24/2021 Date of Discharge: 02/28/2021  Reason for Admission:  Marcia Hill is a 21 y.o. female with a reported history of MDD & anxiety, who presented to the [place] [facility] urgent care [facility]) on 02/23/21 with complaints of worsening depression and SI with a plan to overdose on pills. Pt was deemed to be a danger to herself and voluntarily transferred to this [facility] for treatment and stabilization of her mood.   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 No pertinent surgical history. Family History:  Family History  Problem Relation Age of Onset   ADD / ADHD Sister    Drug abuse Maternal Uncle    Depression Maternal Uncle    Dementia Paternal Grandmother    Family Psychiatric  History: As above Social History:  Social History   Substance and Sexual Activity  Alcohol Use Yes   Alcohol/week: 3.0 standard drinks   Types: 3 Glasses of wine per week     Social History   Substance and Sexual Activity  Drug Use Yes   Types: Benzodiazepines, Marijuana    Social History   Socioeconomic History   Marital status: Single    Spouse name: Not on file   Number of children: 0   Years  of education: Not on file   Highest education level: Some college, no degree  Occupational History   Not on file  Tobacco Use   Smoking status: Never   Smokeless tobacco: Never  Vaping Use   Vaping Use: Every day  Substance and Sexual Activity   Alcohol use: Yes    Alcohol/week: 3.0 standard drinks    Types: 3 Glasses of wine per week   Drug use: Yes    Types: Benzodiazepines, Marijuana   Sexual activity: Not Currently  Other Topics Concern   Not on file  Social History Narrative   Not on file   Social Determinants of Health   Financial Resource Strain: Not on file  Food Insecurity: Not on file  Transportation Needs: Not on file  Physical Activity: Not on file  Stress: Not on file  Social Connections: Not on file   Hospital Course:   During the patient's hospitalization, patient had extensive initial psychiatric evaluation, and follow-up psychiatric evaluations every day.   Psychiatric diagnoses provided upon initial assessment:  MDD (major depressive disorder), recurrent episode, severe ([place]) Anxiety Insomnia    Patient's psychiatric medications were adjusted on admission:  -Start Trintellix 5 mg given today, start 10 mg daily tomorrow (2/26) -Continue Hydroxyzine 50 mg every 6 hours PRN -Start Gabapentin 100 mg TID -Start Trazodone 50 mg nightly  -Continue Ativan 1mg  tabs every 6 hours PRN   During the hospitalization, other adjustments were made to the patient's psychiatric  medication regimen:  Trintellix was increased to 10 mg once daily  -gabapentin was increased to 200 mg tid  -Trazodone was increased to 100 mg qhs    Gradually, patient started adjusting to milieu.   Patient's care was discussed during the interdisciplinary team meeting every day during the hospitalization.   The patient denied having side effects to prescribed psychiatric medication.   The patient reports their target psychiatric symptoms of depression, anxiety, and suicidal thoughts, all  responded well to the psychiatric medications, and the patient reports overall benefit other psychiatric hospitalization. Supportive psychotherapy was provided to the patient. The patient also participated in regular group therapy while admitted.    Labs were reviewed with the patient, and abnormal results were discussed with the patient. The patient denied having suicidal thoughts more than 48 hours prior to discharge.  Patient denies having homicidal thoughts.  Patient denies having auditory hallucinations.  Patient denies any visual hallucinations.  Patient denies having paranoid thoughts.  Physical Findings: AIMS: Facial and Oral Movements Muscles of Facial Expression: None, normal Lips and Perioral Area: None, normal Jaw: None, normal Tongue: None, normal,Extremity Movements Upper (arms, wrists, hands, fingers): None, normal Lower (legs, knees, ankles, toes): None, normal, Trunk Movements Neck, shoulders, hips: None, normal, Overall Severity Severity of abnormal movements (highest score from questions above): None, normal Incapacitation due to abnormal movements: None, normal Patient's awareness of abnormal movements (rate only patient's report): No Awareness, Dental Status Current problems with teeth and/or dentures?: No Does patient usually wear dentures?: No  CIWA:  CIWA-Ar Total: 1 COWS:  n/a  Musculoskeletal: Strength & Muscle Tone: within normal limits Gait & Station: normal Patient leans: N/A  Psychiatric Specialty Exam:  Presentation  General Appearance: Appropriate for Environment; Casual; Well Groomed  Eye Contact:Good  Speech:Clear and Coherent; Normal Rate  Speech Volume:Normal  Handedness:Right  Mood and Affect  Mood:Euthymic  Affect:Appropriate; Congruent  Thought Process  Thought Processes:Linear; Coherent  Descriptions of Associations:Intact  Orientation:Full (Time, Place and Person)  Thought Content:Logical  History of  Schizophrenia/Schizoaffective disorder:No  Duration of Psychotic Symptoms:No data recorded Hallucinations:Hallucinations: None  Ideas of Reference:None  Suicidal Thoughts:Suicidal Thoughts: No  Homicidal Thoughts:Homicidal Thoughts: No  Sensorium  Memory:Immediate Good; Recent Good; Remote Good  Judgment:Good  Insight:Good  Executive Functions  Concentration:Good  Attention Span:Good  [place] of Knowledge:Good  Language:Good  Psychomotor Activity  Psychomotor Activity:Psychomotor Activity: Normal  Assets  Assets:Communication Skills; Desire for Improvement; Physical Health; Social Support  Sleep  Sleep:Sleep: Good Number of Hours of Sleep: 10  Physical Exam: Physical Exam Constitutional:      General: She is not in acute distress.    Appearance: Normal appearance.  HENT:     Head: Normocephalic.     Nose: Nose normal. No congestion.  Eyes:     Pupils: Pupils are equal, round, and reactive to light.  Pulmonary:     Effort: Pulmonary effort is normal. No respiratory distress.  Musculoskeletal:     Cervical back: Normal range of motion. No rigidity.  Neurological:     General: No focal deficit present.     Mental Status: She is alert and oriented to person, place, and time.     Coordination: Coordination normal.  Psychiatric:        Behavior: Behavior normal.        Thought Content: Thought content normal.   Review of Systems  Constitutional: Negative.  Negative for fever.  HENT: Negative.  Negative for sore throat.   Eyes: Negative.  Respiratory: Negative.  Negative for cough and hemoptysis.   Cardiovascular: Negative.  Negative for chest pain.  Gastrointestinal: Negative.  Negative for heartburn, nausea and vomiting.  Genitourinary: Negative.   Musculoskeletal: Negative.   Skin: Negative.   Neurological: Negative.   Endo/Heme/Allergies: Negative.   Psychiatric/Behavioral:  Positive for depression (improving with medications). The  patient is nervous/anxious (improving with medications).   Blood pressure 121/82, pulse 98, temperature 97.8 F (36.6 C), resp. rate 17, height 5\' 5"  (1.651 m), weight 58.1 kg, SpO2 99 %. Body mass index is 21.3 kg/m.  Social History   Tobacco Use  Smoking Status Never  Smokeless Tobacco Never   Tobacco Cessation:  A prescription for an FDA-approved tobacco cessation medication provided at discharge  Blood Alcohol level:  Lab Results  Component Value Date   [facility] <10 [id number]   Metabolic Disorder Labs:  Lab Results  Component Value Date   HGBA1C 4.8 02/23/2021   MPG 91.06 02/23/2021   Lab Results  Component Value Date   PROLACTIN 5.9 02/23/2021   Lab Results  Component Value Date   CHOL 170 02/23/2021   TRIG 37 02/23/2021   HDL 84 02/23/2021   CHOLHDL 2.0 02/23/2021   VLDL 7 02/23/2021   LDLCALC 79 02/23/2021   See Psychiatric Specialty Exam and Suicide Risk Assessment completed by Attending Physician prior to discharge.  Discharge destination:  Home  Is patient on multiple antipsychotic therapies at discharge:  No   Has Patient had three or more failed trials of antipsychotic monotherapy by history:  No  Recommended Plan for Multiple Antipsychotic Therapies: NA  Allergies as of 02/28/2021   No Known Allergies      Medication List     STOP taking these medications    diphenhydrAMINE 25 MG tablet Commonly known as: BENADRYL   hydrOXYzine 25 MG tablet Commonly known as: ATARAX   predniSONE 10 MG (21) Tbpk tablet Commonly known as: STERAPRED UNI-PAK 21 TAB       TAKE these medications      Indication  albuterol 108 (90 Base) MCG/ACT inhaler Commonly known as: VENTOLIN HFA Inhale 1-2 puffs into the lungs every 4 (four) hours as needed for shortness of breath.  Indication: Asthma   gabapentin 100 MG capsule Commonly known as: NEURONTIN Take 2 capsules (200 mg total) by mouth 3 (three) times daily.  Indication: anxiety   hydrocortisone cream  1 % Apply topically 3 (three) times daily.  Indication: Skin Inflammation   nicotine polacrilex 2 MG gum Commonly known as: NICORETTE Take 1 each (2 mg total) by mouth every 4 (four) hours while awake.  Indication: Nicotine Addiction   traZODone 100 MG tablet Commonly known as: DESYREL Take 1 tablet (100 mg total) by mouth at bedtime.  Indication: Trouble Sleeping   vortioxetine HBr 10 MG Tabs tablet Commonly known as: TRINTELLIX Take 1 tablet (10 mg total) by mouth daily. Start taking on: March 01, 2021  Indication: Major Depressive Disorder        Follow-up [place] Follow up on 03/21/2021.   Why: You have an appointment for medication management services on  03/21/21 at 3:10 pm.   This appointment will be Virtual. Contact information: [place] [address] [phone]         [place] Follow up on 03/20/2021.   Specialty: Behavioral Health Why: You have an appointment for therapy services on 03/20/21 at 9:00 am.  This will be a Virtual appointment.  (You are on a cancellation list for a sooner appointment). Contact information: [place] [id number] [phone]               Follow-up recommendations:   The patient is able to verbalize their individual safety plan to this provider. Pt agrees to call the national suicide hotline number (988), 911, or go to the nearest ER in the event of suicidal thoughts.   It is recommended to the patient to continue psychiatric medications as prescribed, after discharge from the hospital.     It is recommended to the patient to follow up with your outpatient psychiatric provider and PCP.   Discussed with the patient, the impact of alcohol, drugs, tobacco have been there overall psychiatric and medical wellbeing, and total abstinence from substance use was recommended the  patient.  Signed: Nicholes Rough, NP 02/28/2021, 3:31 PM

## 2021-02-28 NOTE — BHH Group Notes (Signed)
Pot attended and contributed to group setting

## 2021-02-28 NOTE — Group Note (Signed)
Recreation Therapy Group Note   Group Topic:Animal Assisted Therapy   Group Date: 02/28/2021 Start Time: 1420 End Time: 1507 Facilitators: Caroll Rancher, LRT,CTRS Location: [address]   AAA/T Program Assumption of Risk Form signed by Patient/ or Parent Legal Guardian YES  Patient is free of allergies or severe asthma  YES  Patient reports no fear of animals YES  Patient reports no history of cruelty to animals YES  Patient understands their participation is voluntary YES  Patient washes hands before animal contact YES  Patient washes hands after animal contact YES   Group Description: Patients provided opportunity to interact with trained and credentialed [employer] [facility] dog and the community volunteer/dog handler. Patients practiced appropriate animal interaction and were educated on dog safety outside of the hospital in common community settings. Patients were allowed to use dog toys and other items to practice commands, engage the dog in play, and/or complete routine aspects of animal care.   Education: Charity fundraiser, Health visitor, Communication & Social Skills    Affect/Mood: Appropriate   Participation Level: Engaged    Clinical Observations/Individualized Feedback: Pt attended and was appropriate.  Pt asked questions of our volunteer as well as shared stories of pets back home.     Plan: Continue to engage patient in RT group sessions 2-3x/week.   Caroll Rancher, Antonietta Jewel 02/28/2021 3:25 PM

## 2021-03-20 ENCOUNTER — Other Ambulatory Visit: Payer: Self-pay

## 2021-03-20 ENCOUNTER — Ambulatory Visit (HOSPITAL_COMMUNITY): Payer: BC Managed Care – PPO | Admitting: Clinical

## 2021-03-20 ENCOUNTER — Encounter (HOSPITAL_COMMUNITY): Payer: Self-pay

## 2021-03-21 ENCOUNTER — Ambulatory Visit: Payer: BC Managed Care – PPO | Admitting: Family Medicine

## 2021-10-07 ENCOUNTER — Ambulatory Visit
Admission: EM | Admit: 2021-10-07 | Discharge: 2021-10-07 | Disposition: A | Discharge status: Home / Self Care | Payer: BC Managed Care – PPO

## 2021-10-07 ENCOUNTER — Emergency Department (HOSPITAL_BASED_OUTPATIENT_CLINIC_OR_DEPARTMENT_OTHER): Payer: BC Managed Care – PPO

## 2021-10-07 ENCOUNTER — Encounter (HOSPITAL_BASED_OUTPATIENT_CLINIC_OR_DEPARTMENT_OTHER): Payer: Self-pay | Admitting: Emergency Medicine

## 2021-10-07 ENCOUNTER — Other Ambulatory Visit: Payer: Self-pay

## 2021-10-07 ENCOUNTER — Ambulatory Visit: Admit: 2021-10-07 | Payer: BC Managed Care – PPO

## 2021-10-07 ENCOUNTER — Emergency Department (HOSPITAL_BASED_OUTPATIENT_CLINIC_OR_DEPARTMENT_OTHER)
Admission: EM | Admit: 2021-10-07 | Discharge: 2021-10-07 | Disposition: A | Discharge status: Home / Self Care | Payer: BC Managed Care – PPO | Attending: Emergency Medicine | Admitting: Emergency Medicine

## 2021-10-07 DIAGNOSIS — R197 Diarrhea, unspecified: Secondary | ICD-10-CM | POA: Diagnosis not present

## 2021-10-07 DIAGNOSIS — E860 Dehydration: Secondary | ICD-10-CM

## 2021-10-07 DIAGNOSIS — K920 Hematemesis: Secondary | ICD-10-CM | POA: Insufficient documentation

## 2021-10-07 DIAGNOSIS — R112 Nausea with vomiting, unspecified: Secondary | ICD-10-CM

## 2021-10-07 LAB — COMPREHENSIVE METABOLIC PANEL
ALT: 9 U/L (ref 0–44)
AST: 16 U/L (ref 15–41)
Albumin: 4.6 g/dL (ref 3.5–5.0)
Alkaline Phosphatase: 53 U/L (ref 38–126)
Anion gap: 10 (ref 5–15)
BUN: 12 mg/dL (ref 6–20)
CO2: 23 mmol/L (ref 22–32)
Calcium: 9.4 mg/dL (ref 8.9–10.3)
Chloride: 105 mmol/L (ref 98–111)
Creatinine, Ser: 0.68 mg/dL (ref 0.44–1.00)
GFR, Estimated: >60 mL/min (ref >60)
Glucose, Bld: 96 mg/dL (ref 70–99)
Potassium: 4.3 mmol/L (ref 3.5–5.1)
Sodium: 138 mmol/L (ref 135–145)
Total Bilirubin: 1.1 mg/dL (ref 0.3–1.2)
Total Protein: 7.7 g/dL (ref 6.5–8.1)

## 2021-10-07 LAB — URINALYSIS, ROUTINE W REFLEX MICROSCOPIC
Bilirubin Urine: NEGATIVE
Glucose, UA: NEGATIVE mg/dL
Hgb urine dipstick: NEGATIVE
Leukocytes,Ua: NEGATIVE
Nitrite: NEGATIVE
Specific Gravity, Urine: 1.024 (ref 1.005–1.030)
pH: 7.5 (ref 5.0–8.0)

## 2021-10-07 LAB — CBC
HCT: 39.7 % (ref 36.0–46.0)
Hemoglobin: 13.3 g/dL (ref 12.0–15.0)
MCH: 30 pg (ref 26.0–34.0)
MCHC: 33.5 g/dL (ref 30.0–36.0)
MCV: 89.4 fL (ref 80.0–100.0)
Platelets: 259 10*3/uL (ref 150–400)
RBC: 4.44 MIL/uL (ref 3.87–5.11)
RDW: 12.7 % (ref 11.5–15.5)
WBC: 12.7 10*3/uL — ABNORMAL HIGH (ref 4.0–10.5)
nRBC: 0 % (ref 0.0–0.2)

## 2021-10-07 LAB — LIPASE, BLOOD: Lipase: 11 U/L (ref 11–51)

## 2021-10-07 LAB — PREGNANCY, URINE: Preg Test, Ur: NEGATIVE

## 2021-10-07 MED ORDER — IOHEXOL 300 MG/ML  SOLN
100.0000 mL | Freq: Once | INTRAMUSCULAR | Status: AC | PRN
  Administered 2021-10-07: 80 mL via INTRAVENOUS

## 2021-10-07 MED ORDER — ONDANSETRON 4 MG PO TBDP: 4.0000 mg | ORAL_TABLET | Freq: Three times a day (TID) | ORAL | 0 refills | Status: DC | PRN

## 2021-10-07 MED ORDER — FAMOTIDINE IN NACL 20-0.9 MG/50ML-% IV SOLN
20.0000 mg | Freq: Once | INTRAVENOUS | Status: AC
  Administered 2021-10-07: 20 mg via INTRAVENOUS
  Filled 2021-10-07: qty 50

## 2021-10-07 MED ORDER — MORPHINE SULFATE (PF) 4 MG/ML IV SOLN
4.0000 mg | Freq: Once | INTRAVENOUS | Status: AC
  Administered 2021-10-07: 4 mg via INTRAVENOUS
  Filled 2021-10-07: qty 1

## 2021-10-07 MED ORDER — ONDANSETRON HCL 4 MG/2ML IJ SOLN
4.0000 mg | Freq: Once | INTRAMUSCULAR | Status: AC
  Administered 2021-10-07: 4 mg via INTRAVENOUS
  Filled 2021-10-07: qty 2

## 2021-10-07 MED ORDER — SODIUM CHLORIDE 0.9 % IV BOLUS
1000.0000 mL | Freq: Once | INTRAVENOUS | Status: AC
  Administered 2021-10-07: 1000 mL via INTRAVENOUS

## 2021-10-07 NOTE — ED Triage Notes (Signed)
Pt c/o vomiting that began yesterday and states this morning she began having diarrhea. The patient states she did noticed some streaks of blood in stool and bright red blood in her emesis. The patient states she has a hx of IBS.   Home interventions: none

## 2021-10-07 NOTE — ED Provider Notes (Signed)
Patient presents urgent care complaining of vomiting that began yesterday.  Patient states has been vomiting bright red blood and that when she is vomiting she feels as searing, ripping pain in her upper left abdomen.  States she has also been having diarrhea with small streaks of blood in her stool as well.    Patient advised that given her history of IBS, acute abdominal pain and vomiting bright red blood, she will require advanced imaging.  I have recommended that she go to the emergency room at this time.  Vital signs are stable at this time.  Patient seems to be mentating well although she does appear pale.  Patient is here with her friend who agrees to transport her.  Patient verbalized understanding and agreed to emergency room now.   Lynden Oxford Scales, PA-C 10/07/21 1528

## 2021-10-07 NOTE — ED Provider Notes (Signed)
[facility] EMERGENCY DEPT Provider Note   CSN: [id number] Arrival date & time: 10/07/21  1536     History  Chief Complaint  Patient presents with   Hematemesis    Marcia Hill is a 21 y.o. female.  Pt is a 21 yo female with a pmhx significant for IBS, depression, and anxiety.  Pt started having n/v/d yesterday.  She had some blood in her emesis and in her diarrhea today.  She denies fevers.  No one else is sick.  She does not recall eating anything that tasted bad.         Home Medications Prior to Admission medications   Medication Sig Start Date End Date Taking? Authorizing Provider  ondansetron (ZOFRAN-ODT) 4 MG disintegrating tablet Take 1 tablet (4 mg total) by mouth every 8 (eight) hours as needed for nausea or vomiting. 10/07/21  Yes Isla Pence, MD  albuterol (VENTOLIN HFA) 108 (90 Base) MCG/ACT inhaler Inhale 1-2 puffs into the lungs every 4 (four) hours as needed for shortness of breath. 11/19/20   [provider]  gabapentin (NEURONTIN) 100 MG capsule Take 2 capsules (200 mg total) by mouth 3 (three) times daily. 02/28/21   Nicholes Rough, NP  hydrocortisone cream 1 % Apply topically 3 (three) times daily. 02/28/21   Nicholes Rough, NP  nicotine polacrilex (NICORETTE) 2 MG gum Take 1 each (2 mg total) by mouth every 4 (four) hours while awake. 02/28/21   Nicholes Rough, NP  traZODone (DESYREL) 100 MG tablet Take 1 tablet (100 mg total) by mouth at bedtime. 02/28/21   Nicholes Rough, NP  vortioxetine HBr (TRINTELLIX) 10 MG TABS tablet Take 1 tablet (10 mg total) by mouth daily. 03/01/21   Nicholes Rough, NP      Allergies    Patient has no known allergies.    Review of Systems   Review of Systems  Gastrointestinal:  Positive for abdominal pain, diarrhea, nausea and vomiting.  All other systems reviewed and are negative.   Physical Exam Updated Vital Signs BP 111/68   Pulse (!) 54   Temp 98.8 F (37.1 C)   Resp 18   LMP 09/16/2021    SpO2 100%  Physical Exam Vitals and nursing note reviewed.  Constitutional:      Appearance: Normal appearance.  HENT:     Head: Normocephalic and atraumatic.     Right Ear: External ear normal.     Left Ear: External ear normal.     Nose: Nose normal.     Mouth/Throat:     Mouth: Mucous membranes are dry.  Eyes:     Extraocular Movements: Extraocular movements intact.     Conjunctiva/sclera: Conjunctivae normal.     Pupils: Pupils are equal, round, and reactive to light.  Cardiovascular:     Rate and Rhythm: Normal rate and regular rhythm.     Pulses: Normal pulses.     Heart sounds: Normal heart sounds.  Pulmonary:     Effort: Pulmonary effort is normal.     Breath sounds: Normal breath sounds.  Abdominal:     General: Abdomen is flat. Bowel sounds are normal.     Palpations: Abdomen is soft.     Tenderness: There is generalized abdominal tenderness.  Musculoskeletal:        General: Normal range of motion.     Cervical back: Normal range of motion and neck supple.  Skin:    General: Skin is warm.     Capillary Refill: Capillary refill takes  less than 2 seconds.  Neurological:     General: No focal deficit present.     Mental Status: She is alert and oriented to person, place, and time.  Psychiatric:        Mood and Affect: Mood normal.        Behavior: Behavior normal.     ED Results / Procedures / Treatments   Labs (all labs ordered are listed, but only abnormal results are displayed) Labs Reviewed  CBC - Abnormal; Notable for the following components:      Result Value   WBC 12.7 (*)    All other components within normal limits  URINALYSIS, ROUTINE W REFLEX MICROSCOPIC - Abnormal; Notable for the following components:   Ketones, ur TRACE (*)    Protein, ur TRACE (*)    All other components within normal limits  LIPASE, BLOOD  COMPREHENSIVE METABOLIC PANEL  PREGNANCY, URINE    EKG None  Radiology CT ABDOMEN PELVIS W CONTRAST  Result Date:  10/07/2021 CLINICAL DATA:  Vomiting and bloody stool. EXAM: CT ABDOMEN AND PELVIS WITH CONTRAST TECHNIQUE: Multidetector CT imaging of the abdomen and pelvis was performed using the standard protocol following bolus administration of intravenous contrast. RADIATION DOSE REDUCTION: This exam was performed according to the departmental dose-optimization program which includes automated exposure control, adjustment of the mA and/or kV according to patient size and/or use of iterative reconstruction technique. CONTRAST:  58mL OMNIPAQUE IOHEXOL 300 MG/ML  SOLN COMPARISON:  March 20, 2012 FINDINGS: Lower chest: No acute abnormality. Hepatobiliary: Subcentimeter parenchymal calcifications are seen along the posteromedial and posterior aspects of the right lobe of the liver. No gallstones, gallbladder wall thickening, or biliary dilatation. Pancreas: Unremarkable. No pancreatic ductal dilatation or surrounding inflammatory changes. Spleen: A punctate parenchymal calcification is seen within the posterolateral aspect of an otherwise normal-appearing spleen. Adrenals/Urinary Tract: Adrenal glands are unremarkable. Kidneys are normal, without renal calculi, focal lesion, or hydronephrosis. Bladder is unremarkable. Stomach/Bowel: Stomach is within normal limits. Appendix appears normal. No evidence of bowel wall thickening, distention, or inflammatory changes. Vascular/Lymphatic: No significant vascular findings are present. No enlarged abdominal or pelvic lymph nodes. Reproductive: The uterus and right adnexa are unremarkable. A 1.6 cm x 1.7 cm partially collapsed left adnexal cyst is noted. Other: No abdominal wall hernia or abnormality. A small amount of posterior pelvic free fluid is seen. Musculoskeletal: No acute or significant osseous findings. IMPRESSION: 1. Partially collapsed left adnexal cyst, likely ovarian in origin. No follow-up imaging is recommended. This recommendation follows ACR consensus guidelines: White  Paper of the ACR Incidental Findings Committee II on Adnexal Findings. J Am Coll Radiol 2013:10:675-681. 2. Small amount of posterior pelvic free fluid, likely physiologic. 3. Evidence of prior granulomatous disease. Electronically Signed   By: Virgina Norfolk M.D.   On: 10/07/2021 20:36    Procedures Procedures    Medications Ordered in ED Medications  sodium chloride 0.9 % bolus 1,000 mL (1,000 mLs Intravenous New Bag/Given 10/07/21 2012)  morphine (PF) 4 MG/ML injection 4 mg (4 mg Intravenous Given 10/07/21 2013)  ondansetron (ZOFRAN) injection 4 mg (4 mg Intravenous Given 10/07/21 2013)  famotidine (PEPCID) IVPB 20 mg premix (0 mg Intravenous Stopped 10/07/21 2043)  iohexol (OMNIPAQUE) 300 MG/ML solution 100 mL (80 mLs Intravenous Contrast Given 10/07/21 2020)    ED Course/ Medical Decision Making/ A&P                           Medical  Decision Making Amount and/or Complexity of Data Reviewed Labs: ordered. Radiology: ordered.  Risk Prescription drug management.   This patient presents to the ED for concern of abd pain with n/v/d, this involves an extensive number of treatment options, and is a complaint that carries with it a high risk of complications and morbidity.  The differential diagnosis includes gastritis, gastroenteritis, colitis, uti, pregnancy   Co morbidities that complicate the patient evaluation  Ibs, anxiety   Additional history obtained:  Additional history obtained from epic chart review External records from outside source obtained and reviewed including mom   Lab Tests:  I Ordered, and personally interpreted labs.  The pertinent results include:  ua nl, preg neg, cmp nl, lip nl, cbc with slight elevation in wbc 12.7   Imaging Studies ordered:  I ordered imaging studies including ct abd/pelvis  I independently visualized and interpreted imaging which showed  IMPRESSION:  1. Partially collapsed left adnexal cyst, likely ovarian in origin.  No  follow-up imaging is recommended. This recommendation follows ACR  consensus guidelines: White Paper of the ACR Incidental Findings  Committee II on Adnexal Findings. J Am Coll Radiol 2013:10:675-681.  2. Small amount of posterior pelvic free fluid, likely physiologic.  3. Evidence of prior granulomatous disease.   I agree with the radiologist interpretation   Cardiac Monitoring:  The patient was maintained on a cardiac monitor.  I personally viewed and interpreted the cardiac monitored which showed an underlying rhythm of: nsr   Medicines ordered and prescription drug management:  I ordered medication including zoran and morphine  for nausea and pain  Reevaluation of the patient after these medicines showed that the patient improved I have reviewed the patients home medicines and have made adjustments as needed   Test Considered:  ct   Critical Interventions:  Ivfs/pain/nausea control  Problem List / ED Course:  Abd pain/n/v/d:  likely viral.  Pt feels much better after IVFs.  She is able to tolerate po fluids. CT neg.  Labs unremarkable.  Pt is stable for d/c.  She is to return if worse.    Reevaluation:  After the interventions noted above, I reevaluated the patient and found that they have :improved   Social Determinants of Health:  Lives at home   Dispostion:  After consideration of the diagnostic results and the patients response to treatment, I feel that the patent would benefit from discharge with outpatient f/u.          Final Clinical Impression(s) / ED Diagnoses Final diagnoses:  Dehydration  Nausea vomiting and diarrhea    Rx / DC Orders ED Discharge Orders          Ordered    ondansetron (ZOFRAN-ODT) 4 MG disintegrating tablet  Every 8 hours PRN        10/07/21 2205              Isla Pence, MD 10/07/21 2211

## 2021-10-07 NOTE — ED Notes (Signed)
Patient left wallet and pouch in room, called patient to pick up and patient states sue is on the way back to pick up her items.

## 2021-10-07 NOTE — ED Notes (Signed)
Patient is being discharged from the Urgent Care and sent to the Emergency Department via POV. Per L. Morgan-Scales PA-C, patient is in need of higher level of care due to need for further evaluation . Patient is aware and verbalizes understanding of plan of care.  Vitals:   10/07/21 1427  BP: 103/66  Pulse: 64  Resp: 16  Temp: 98.5 F (36.9 C)  SpO2: 96%

## 2021-10-07 NOTE — ED Triage Notes (Addendum)
Pt presents to ED POV. Pt c/o hematemesis and bloody stool. Pt reports that emesis began yesterday but blood began today. Reports hx ibs

## 2021-10-07 NOTE — ED Notes (Signed)
Items picked up successfully, name and birthday verified with patient before returning items.

## 2021-10-07 NOTE — ED Notes (Signed)
Pt verbalizes understanding of discharge instructions. Opportunity for questioning and answers were provided. Pt discharged from ED with mom.

## 2022-01-09 ENCOUNTER — Emergency Department (HOSPITAL_BASED_OUTPATIENT_CLINIC_OR_DEPARTMENT_OTHER): Payer: BC Managed Care – PPO | Admitting: Radiology

## 2022-01-09 ENCOUNTER — Other Ambulatory Visit: Payer: Self-pay

## 2022-01-09 ENCOUNTER — Emergency Department (HOSPITAL_BASED_OUTPATIENT_CLINIC_OR_DEPARTMENT_OTHER)
Admission: EM | Admit: 2022-01-09 | Discharge: 2022-01-09 | Disposition: A | Discharge status: Home / Self Care | Payer: BC Managed Care – PPO | Attending: Emergency Medicine | Admitting: Emergency Medicine

## 2022-01-09 ENCOUNTER — Encounter (HOSPITAL_BASED_OUTPATIENT_CLINIC_OR_DEPARTMENT_OTHER): Payer: Self-pay | Admitting: Emergency Medicine

## 2022-01-09 DIAGNOSIS — W01198A Fall on same level from slipping, tripping and stumbling with subsequent striking against other object, initial encounter: Secondary | ICD-10-CM | POA: Insufficient documentation

## 2022-01-09 DIAGNOSIS — R55 Syncope and collapse: Secondary | ICD-10-CM | POA: Diagnosis not present

## 2022-01-09 DIAGNOSIS — M25561 Pain in right knee: Secondary | ICD-10-CM

## 2022-01-09 DIAGNOSIS — S0993XA Unspecified injury of face, initial encounter: Secondary | ICD-10-CM | POA: Diagnosis present

## 2022-01-09 DIAGNOSIS — S0083XA Contusion of other part of head, initial encounter: Secondary | ICD-10-CM | POA: Diagnosis not present

## 2022-01-09 LAB — CBC WITH DIFFERENTIAL/PLATELET
Abs Immature Granulocytes: 0.02 10*3/uL (ref 0.00–0.07)
Basophils Absolute: 0.1 10*3/uL (ref 0.0–0.1)
Basophils Relative: 1 %
Eosinophils Absolute: 0.3 10*3/uL (ref 0.0–0.5)
Eosinophils Relative: 3 %
HCT: 39.3 % (ref 36.0–46.0)
Hemoglobin: 13.1 g/dL (ref 12.0–15.0)
Immature Granulocytes: 0 %
Lymphocytes Relative: 23 %
Lymphs Abs: 2 10*3/uL (ref 0.7–4.0)
MCH: 29.8 pg (ref 26.0–34.0)
MCHC: 33.3 g/dL (ref 30.0–36.0)
MCV: 89.3 fL (ref 80.0–100.0)
Monocytes Absolute: 0.6 10*3/uL (ref 0.1–1.0)
Monocytes Relative: 6 %
Neutro Abs: 5.8 10*3/uL (ref 1.7–7.7)
Neutrophils Relative %: 67 %
Platelets: 263 10*3/uL (ref 150–400)
RBC: 4.4 MIL/uL (ref 3.87–5.11)
RDW: 12.1 % (ref 11.5–15.5)
WBC: 8.6 10*3/uL (ref 4.0–10.5)
nRBC: 0 % (ref 0.0–0.2)

## 2022-01-09 LAB — BASIC METABOLIC PANEL
Anion gap: 9 (ref 5–15)
BUN: 16 mg/dL (ref 6–20)
CO2: 25 mmol/L (ref 22–32)
Calcium: 9.4 mg/dL (ref 8.9–10.3)
Chloride: 103 mmol/L (ref 98–111)
Creatinine, Ser: 0.72 mg/dL (ref 0.44–1.00)
GFR, Estimated: >60 mL/min (ref >60)
Glucose, Bld: 132 mg/dL — ABNORMAL HIGH (ref 70–99)
Potassium: 4 mmol/L (ref 3.5–5.1)
Sodium: 137 mmol/L (ref 135–145)

## 2022-01-09 MED ORDER — KETOROLAC TROMETHAMINE 15 MG/ML IJ SOLN
15.0000 mg | Freq: Once | INTRAMUSCULAR | Status: AC
  Administered 2022-01-09: 15 mg via INTRAVENOUS
  Filled 2022-01-09: qty 1

## 2022-01-09 MED ORDER — SODIUM CHLORIDE 0.9 % IV BOLUS
1000.0000 mL | Freq: Once | INTRAVENOUS | Status: AC
  Administered 2022-01-09: 1000 mL via INTRAVENOUS

## 2022-01-09 MED ORDER — KETOROLAC TROMETHAMINE 30 MG/ML IJ SOLN: 15.0000 mg | Freq: Once | INTRAMUSCULAR | Status: DC

## 2022-01-09 NOTE — ED Triage Notes (Signed)
Yesterday around 1pm report LOC, passing out fall hit head.  Some dizziness before but felt better after eating. Small bruise on left side of face. Pain in right knee down to ankle. Ambulatory to triage

## 2022-01-09 NOTE — ED Provider Notes (Signed)
[facility] EMERGENCY DEPT Provider Note   CSN: [id number] Arrival date & time: 01/09/22  1549     History  Chief Complaint  Patient presents with   Loss of Consciousness   Knee Pain    Marcia Hill is a 22 y.o. female.  Patient presents to the emergency department for evaluation of right sided knee pain.  Patient states that she has syncopal episode yesterday around 1 PM.  She was standing in line to check out at a store.  She states that her menstrual cycle began on January 6 and that she wonders if this may have led to the syncopal episode.  She denies vomiting, amnesia around the time of the event.  She states that when she fell she hit her right knee apparently on the ground.  She states that she is able to walk on the knee but it is painful.  Last night the patient went to an orthopedic urgent care but they refused to see her until she has been cleared due to the syncopal episode by the emergency department.  Past medical history significant for anxiety, depression, previous shoulder dislocation  HPI     Home Medications Prior to Admission medications   Medication Sig Start Date End Date Taking? Authorizing Provider  albuterol (VENTOLIN HFA) 108 (90 Base) MCG/ACT inhaler Inhale 1-2 puffs into the lungs every 4 (four) hours as needed for shortness of breath. 11/19/20   [provider]  gabapentin (NEURONTIN) 100 MG capsule Take 2 capsules (200 mg total) by mouth 3 (three) times daily. 02/28/21   Starleen Blue, NP  hydrocortisone cream 1 % Apply topically 3 (three) times daily. 02/28/21   Starleen Blue, NP  nicotine polacrilex (NICORETTE) 2 MG gum Take 1 each (2 mg total) by mouth every 4 (four) hours while awake. 02/28/21   Starleen Blue, NP  ondansetron (ZOFRAN-ODT) 4 MG disintegrating tablet Take 1 tablet (4 mg total) by mouth every 8 (eight) hours as needed for nausea or vomiting. 10/07/21   Jacalyn Lefevre, MD  traZODone (DESYREL) 100 MG tablet Take 1  tablet (100 mg total) by mouth at bedtime. 02/28/21   Starleen Blue, NP  vortioxetine HBr (TRINTELLIX) 10 MG TABS tablet Take 1 tablet (10 mg total) by mouth daily. 03/01/21   Starleen Blue, NP      Allergies    Patient has no known allergies.    Review of Systems   Review of Systems  Respiratory:  Negative for shortness of breath.   Cardiovascular:  Negative for chest pain.  Gastrointestinal:  Negative for abdominal pain, nausea and vomiting.  Genitourinary:  Negative for dysuria, hematuria and vaginal discharge.  Musculoskeletal:  Positive for arthralgias.  Neurological:  Positive for syncope.    Physical Exam Updated Vital Signs BP 113/71 (BP Location: Right Arm)   Pulse 95   Temp 97.8 F (36.6 C)   Resp 16   LMP 01/06/2022   SpO2 100%  Physical Exam Vitals and nursing note reviewed.  Constitutional:      General: She is not in acute distress.    Appearance: She is well-developed.  HENT:     Head: Normocephalic and atraumatic.     Mouth/Throat:     Mouth: Mucous membranes are moist.  Eyes:     Conjunctiva/sclera: Conjunctivae normal.  Cardiovascular:     Rate and Rhythm: Normal rate and regular rhythm.     Heart sounds: No murmur heard.    Comments: Heart rate 96 bpm checked manually Pulmonary:  Effort: Pulmonary effort is normal. No respiratory distress.     Breath sounds: Normal breath sounds.  Abdominal:     Palpations: Abdomen is soft.     Tenderness: There is no abdominal tenderness.  Musculoskeletal:        General: No swelling.     Cervical back: Neck supple.  Skin:    General: Skin is warm and dry.     Capillary Refill: Capillary refill takes less than 2 seconds.     Comments: Small bruise on left cheek  Neurological:     General: No focal deficit present.     Mental Status: She is alert.     Sensory: No sensory deficit.     Motor: No weakness.     Gait: Gait normal.  Psychiatric:        Mood and Affect: Mood normal.     ED Results /  Procedures / Treatments   Labs (all labs ordered are listed, but only abnormal results are displayed) Labs Reviewed  BASIC METABOLIC PANEL - Abnormal; Notable for the following components:      Result Value   Glucose, Bld 132 (*)    All other components within normal limits  CBC WITH DIFFERENTIAL/PLATELET    EKG EKG Interpretation  Date/Time:  Tuesday January 09 2022 15:56:54 EST Ventricular Rate:  127 PR Interval:  148 QRS Duration: 72 QT Interval:  300 QTC Calculation: 436 R Axis:   145 Text Interpretation: Sinus tachycardia Biatrial enlargement Right axis deviation Cannot rule out Anterior infarct , age undetermined Abnormal ECG When compared with ECG of 25-Feb-2021 14:46, Vent. rate has increased BY  66 BPM QRS axis Shifted right Confirmed by Linwood Dibbles [id number]) on 01/09/2022 4:03:43 PM  Radiology DG Knee Complete 4 Views Right  Result Date: 01/09/2022 CLINICAL DATA:  Fall yesterday with right knee pain. EXAM: RIGHT KNEE - COMPLETE 4+ VIEW COMPARISON:  None Available. FINDINGS: No evidence of fracture, dislocation, or joint effusion. No evidence of arthropathy or other focal bone abnormality. Soft tissues are unremarkable. IMPRESSION: Negative. Electronically Signed   By: Elberta Fortis M.D.   On: 01/09/2022 16:23    Procedures Procedures    Medications Ordered in ED Medications  sodium chloride 0.9 % bolus 1,000 mL (1,000 mLs Intravenous New Bag/Given 01/09/22 1816)  ketorolac (TORADOL) 15 MG/ML injection 15 mg (15 mg Intravenous Given 01/09/22 1817)    ED Course/ Medical Decision Making/ A&P                           Medical Decision Making Amount and/or Complexity of Data Reviewed Labs: ordered. Radiology: ordered.  Risk Prescription drug management.   This patient presents to the ED for concern of right knee pain, this involves an extensive number of treatment options, and is a complaint that carries with it a high risk of complications and morbidity.  The  differential diagnosis includes fracture, dislocation, ligament injury, meniscal injury, and others Patient also complains of a syncopal episode.  Differential includes orthostatic hypotension, vasovagal response, dysrhythmia, and others   Co morbidities that complicate the patient evaluation  None   Lab Tests:  I Ordered, and personally interpreted labs.  The pertinent results include: Grossly unremarkable CBC, BMP   Imaging Studies ordered:  I ordered imaging studies including plain films of the right knee I independently visualized and interpreted imaging which showed no fracture or dislocation I agree with the radiologist interpretation   Cardiac Monitoring: /  EKG:  The patient was maintained on a cardiac monitor.  I personally viewed and interpreted the cardiac monitored which showed an underlying rhythm of: Sinus tachycardia  Problem List / ED Course / Critical interventions / Medication management   I ordered medication including normal saline for possible dehydration, Toradol for inflammation Reevaluation of the patient after these medicines showed that the patient improved I have reviewed the patients home medicines and have made adjustments as needed   Test / Admission - Considered:  Films of the right knee were negative for any acute fracture or dislocation.  No significant swelling noted on exam.  I discussed a knee immobilizer and crutches with the patient with outpatient orthopedic follow-up but the patient prefers to follow-up with orthopedic urgent care.  That she is planning to go to the orthopedic urgent care tonight I will defer treatment of the right knee to them. Based on [place] head CT rules there is no indication at this time for imaging.  The patient has no history of seizure, GCS is 15, no signs of skull fracture, no vomiting, and no dangerous mechanism.  The patient was slightly tachycardic upon arrival but this normalized while she was here.  I  manually rechecked her pulse and it was 96 bpm.  I administered a liter of fluid in case the patient was somewhat dehydrated.She felt much better after the fluids were administered. She was able to ambulate with no difficulty. Vitals remained stable with standing and ambulation. No dysrhythmia on EKG, electrolytes appear normal. Normal hemoglobin. Question dehydration vs vasovagal syncope. I see no indication for further emergent workup at this time. The patient may follow up as needed with her primary care provider. She should return to the emergency department if she experiences another syncopal episode or other life threatening symptoms        Final Clinical Impression(s) / ED Diagnoses Final diagnoses:  Syncope, unspecified syncope type  Acute pain of right knee    Rx / DC Orders ED Discharge Orders     None         Ronny Bacon 01/09/22 1842    Dorie Rank, MD 01/10/22 1505

## 2022-01-09 NOTE — ED Notes (Signed)
Pt ambulatory with limping gait to room. Gown provided to patient

## 2022-01-09 NOTE — ED Notes (Signed)
Pt ambulatory with steady gait to restroom with family as standby assist

## 2022-01-09 NOTE — Discharge Instructions (Addendum)
Your x-rays of the right knee were unremarkable. As discussed, please follow up with orthopedics. I have listed the contact information of the on call provider for follow up as needed. Since you declined crutches and a knee immobilizer today, please be sure to follow up with orthopedics and try to limit weight bearing on the right lower extremity. I recommend 1000mg  acetaminophen every 6 hours as needed and 600mg  ibuprofen as needed. Please avoid ibuprofen until tomorrow afternoon as the Toradol given by IV is a similar medication.  Please follow up with your primary care provider for further evaluation as needed of your syncopal episode. If you experience another syncopal event or develop other life threatening symptoms please return to the emergency department.

## 2022-01-12 ENCOUNTER — Telehealth: Payer: Self-pay

## 2022-01-12 NOTE — Telephone Encounter (Signed)
Transition Care Management Unsuccessful Follow-up Telephone Call  Date of discharge and from where:  1/9; [facility]   Attempts:  1st Attempt  Reason for unsuccessful TCM follow-up call:  Unable to leave message

## 2022-03-04 ENCOUNTER — Other Ambulatory Visit: Payer: Self-pay | Admitting: Family Medicine

## 2022-03-04 DIAGNOSIS — F419 Anxiety disorder, unspecified: Secondary | ICD-10-CM

## 2022-04-13 ENCOUNTER — Telehealth: Payer: BC Managed Care – PPO | Admitting: Physician Assistant

## 2022-04-13 DIAGNOSIS — N3000 Acute cystitis without hematuria: Secondary | ICD-10-CM

## 2022-04-13 MED ORDER — NITROFURANTOIN MONOHYD MACRO 100 MG PO CAPS: 100.0000 mg | ORAL_CAPSULE | Freq: Two times a day (BID) | ORAL | 0 refills | Status: AC

## 2022-04-13 NOTE — Progress Notes (Signed)
E-Visit for Urinary Problems  We are sorry that you are not feeling well.  Here is how we plan to help!  Based on what you shared with me it looks like you most likely have a simple urinary tract infection.  A UTI (Urinary Tract Infection) is a bacterial infection of the bladder.  Most cases of urinary tract infections are simple to treat but a key part of your care is to encourage you to drink plenty of fluids and watch your symptoms carefully.  I have prescribed MacroBid 100 mg twice a day for 5 days.  Your symptoms should gradually improve. Call us if the burning in your urine worsens, you develop worsening fever, back pain or pelvic pain or if your symptoms do not resolve after completing the antibiotic.  Urinary tract infections can be prevented by drinking plenty of water to keep your body hydrated.  Also be sure when you wipe, wipe from front to back and don't hold it in!  If possible, empty your bladder every 4 hours.  HOME CARE Drink plenty of fluids Compete the full course of the antibiotics even if the symptoms resolve Remember, when you need to go.go. Holding in your urine can increase the likelihood of getting a UTI! GET HELP RIGHT AWAY IF: You cannot urinate You get a high fever Worsening back pain occurs You see blood in your urine You feel sick to your stomach or throw up You feel like you are going to pass out  MAKE SURE YOU  Understand these instructions. Will watch your condition. Will get help right away if you are not doing well or get worse.   Thank you for choosing an e-visit.  Your e-visit answers were reviewed by a board certified advanced clinical practitioner to complete your personal care plan. Depending upon the condition, your plan could have included both over the counter or prescription medications.  Please review your pharmacy choice. Make sure the pharmacy is open so you can pick up prescription now. If there is a problem, you may contact your  provider through MyChart messaging and have the prescription routed to another pharmacy.  Your safety is important to us. If you have drug allergies check your prescription carefully.   For the next 24 hours you can use MyChart to ask questions about today's visit, request a non-urgent call back, or ask for a work or school excuse. You will get an email in the next two days asking about your experience. I hope that your e-visit has been valuable and will speed your recovery.   I have spent 5 minutes in review of e-visit questionnaire, review and updating patient chart, medical decision making and response to patient.   Adea Geisel Z Ward, PA-C    

## 2022-07-20 ENCOUNTER — Ambulatory Visit: Payer: BC Managed Care – PPO | Admitting: Family Medicine

## 2022-07-20 VITALS — BP 118/64 | HR 83 | Temp 98.3°F | Ht 65.0 in | Wt 134.0 lb

## 2022-07-20 DIAGNOSIS — F411 Generalized anxiety disorder: Secondary | ICD-10-CM

## 2022-07-20 MED ORDER — TRAZODONE HCL 100 MG PO TABS: 100.0000 mg | ORAL_TABLET | Freq: Every day | ORAL | 3 refills | Status: AC

## 2022-07-20 MED ORDER — BUSPIRONE HCL 7.5 MG PO TABS: 7.5000 mg | ORAL_TABLET | Freq: Two times a day (BID) | ORAL | 1 refills | Status: AC
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
## 2022-10-07 ENCOUNTER — Emergency Department (HOSPITAL_BASED_OUTPATIENT_CLINIC_OR_DEPARTMENT_OTHER)
Admission: EM | Admit: 2022-10-07 | Discharge: 2022-10-07 | Disposition: A | Discharge status: Home / Self Care | Payer: BC Managed Care – PPO | Attending: Emergency Medicine | Admitting: Emergency Medicine

## 2022-10-07 ENCOUNTER — Emergency Department (HOSPITAL_BASED_OUTPATIENT_CLINIC_OR_DEPARTMENT_OTHER): Payer: BC Managed Care – PPO

## 2022-10-07 ENCOUNTER — Other Ambulatory Visit: Payer: Self-pay

## 2022-10-07 ENCOUNTER — Encounter (HOSPITAL_BASED_OUTPATIENT_CLINIC_OR_DEPARTMENT_OTHER): Payer: Self-pay

## 2022-10-07 DIAGNOSIS — W228XXA Striking against or struck by other objects, initial encounter: Secondary | ICD-10-CM | POA: Insufficient documentation

## 2022-10-07 DIAGNOSIS — S62316A Displaced fracture of base of fifth metacarpal bone, right hand, initial encounter for closed fracture: Secondary | ICD-10-CM | POA: Diagnosis not present

## 2022-10-07 DIAGNOSIS — S6991XA Unspecified injury of right wrist, hand and finger(s), initial encounter: Secondary | ICD-10-CM | POA: Diagnosis present

## 2022-10-07 MED ORDER — HYDROCODONE-ACETAMINOPHEN 5-325 MG PO TABS
1.0000 | ORAL_TABLET | ORAL | 0 refills | Status: AC | PRN
Start: 2022-10-07 — End: ?

## 2022-10-07 MED ORDER — HYDROCODONE-ACETAMINOPHEN 5-325 MG PO TABS
1.0000 | ORAL_TABLET | Freq: Once | ORAL | Status: AC
  Administered 2022-10-07: 1 via ORAL
  Filled 2022-10-07: qty 1

## 2022-10-07 NOTE — Discharge Instructions (Addendum)
You were seen for your broken hand in the emergency department.   At home, please take Tylenol and ibuprofen for your hand.  Take the Norco for any breakthrough pain that you have.  Keep the splint dry that you have on.  Check your MyChart online for the results of any tests that had not resulted by the time you left the emergency department.   Follow-up with the orthopedic surgeon in 2-3 days regarding your visit.    Return immediately to the emergency department if you experience any of the following: Unbearable pain, numbness or weakness of your hand, or any other concerning symptoms.    Thank you for visiting our Emergency Department. It was a pleasure taking care of you today.

## 2022-10-07 NOTE — ED Provider Notes (Signed)
[place] EMERGENCY DEPARTMENT AT [facility] Provider Note   CSN: [id number] Arrival date & time: 10/07/22  1723     History {Add pertinent medical, surgical, social history, OB history to HPI:1} Chief Complaint  Patient presents with   Hand Injury    Marcia Hill is a 22 y.o. female.  22 year old right-handed female who presents emergency department with right hand pain.  Reports that late last night she punched a wall and had pain and swelling of her hand.  Did have some tingling of her ring finger on the right side.  Had shoulder surgery but no hand surgeries.  No other injuries.       Home Medications Prior to Admission medications   Medication Sig Start Date End Date Taking? Authorizing Provider  HYDROcodone-acetaminophen (NORCO/VICODIN) 5-325 MG tablet Take 1 tablet by mouth every 4 (four) hours as needed. 10/07/22  Yes Rondel Baton, MD  busPIRone (BUSPAR) 7.5 MG tablet Take 1 tablet (7.5 mg total) by mouth 2 (two) times daily. 07/20/22   Donita Brooks, MD  hydrocortisone cream 1 % Apply topically 3 (three) times daily. 02/28/21   Starleen Blue, NP  traZODone (DESYREL) 100 MG tablet Take 1 tablet (100 mg total) by mouth at bedtime. 07/20/22   Donita Brooks, MD      Allergies    Patient has no known allergies.    Review of Systems   Review of Systems  Physical Exam Updated Vital Signs BP 125/82 (BP Location: Left Arm)   Pulse (!) 107   Temp 98.2 F (36.8 C)   Resp 16   Ht 5\' 5"  (1.651 m)   Wt 73 kg   LMP 09/23/2022   SpO2 96%   BMI 26.79 kg/m  Physical Exam Musculoskeletal:     Comments: Right hand: Tenderness to palpation at the base of the fifth metacarpal.  No snuffbox tenderness to palpation.  No tenderness palpation elsewhere the hand.  Intact sensation to light touch of all fingers and hand in median, radial, and ulnar distribution.  Radial and ulnar pulses 2+.  Cap refill less than 2 seconds in all 5 fingers.  Pain limits  extension of fourth and fifth finger but is able to flex all joints of the fourth and fifth finger without significant rotational deformity of the pinky.     ED Results / Procedures / Treatments   Labs (all labs ordered are listed, but only abnormal results are displayed) Labs Reviewed - No data to display  EKG None  Radiology DG Hand Complete Right  Result Date: 10/07/2022 CLINICAL DATA:  Right hand pain after injury.  Punched a wall. EXAM: RIGHT HAND - COMPLETE 3+ VIEW COMPARISON:  None Available. FINDINGS: Minimally displaced fifth proximal metacarpal fracture. Mild apex dorsal angulation. No intra-articular involvement. No additional fracture of the hand. The alignment and joint spaces are normal. There is overlying soft tissue edema. IMPRESSION: Minimally displaced and angulated fifth metacarpal fracture. Electronically Signed   By: Narda Rutherford M.D.   On: 10/07/2022 18:00    Procedures Procedures  {Document cardiac monitor, telemetry assessment procedure when appropriate:1}  Medications Ordered in ED Medications  HYDROcodone-acetaminophen (NORCO/VICODIN) 5-325 MG per tablet 1 tablet (1 tablet Oral Given 10/07/22 1826)    ED Course/ Medical Decision Making/ A&P   {   Click here for ABCD2, HEART and other calculatorsREFRESH Note before signing :1}  Medical Decision Making Amount and/or Complexity of Data Reviewed Radiology: ordered.  Risk Prescription drug management.   ***  {Document critical care time when appropriate:1} {Document review of labs and clinical decision tools ie heart score, Chads2Vasc2 etc:1}  {Document your independent review of radiology images, and any outside records:1} {Document your discussion with family members, caretakers, and with consultants:1} {Document social determinants of health affecting pt's care:1} {Document your decision making why or why not admission, treatments were needed:1} Final Clinical  Impression(s) / ED Diagnoses Final diagnoses:  Closed displaced fracture of base of fifth metacarpal bone of right hand, initial encounter    Rx / DC Orders ED Discharge Orders          Ordered    HYDROcodone-acetaminophen (NORCO/VICODIN) 5-325 MG tablet  Every 4 hours PRN        10/07/22 1840

## 2022-10-07 NOTE — ED Triage Notes (Signed)
Pt presents with a R hand injury after punching a wall.

## 2023-08-11 ENCOUNTER — Other Ambulatory Visit: Payer: Self-pay | Admitting: Family Medicine

## 2023-09-10 ENCOUNTER — Other Ambulatory Visit: Payer: Self-pay | Admitting: Family Medicine

## 2023-09-10 NOTE — Telephone Encounter (Unsigned)
 Copied from CRM [id number]. Topic: Clinical - Medication Refill >> Sep 10, 2023 12:59 PM Marcia Hill wrote: Medication: traZODone  (DESYREL ) 100 MG tablet  **Patient state she is going out of town soon and has run out of her sleep medication, if we could get a refill on that ASAP**  Has the patient contacted their pharmacy? Yes (Agent: If no, request that the patient contact the pharmacy for the refill. If patient does not wish to contact the pharmacy document the reason why and proceed with request.) (Agent: If yes, when and what did the pharmacy advise?)  This is the patient's preferred pharmacy:  [id number] [address], [place] - [address] AT [facility] [address] [facility] [name] [place] [address] Phone: [phone] Fax: [phone]  Is this the correct pharmacy for this prescription? Yes If no, delete pharmacy and type the correct one.   Has the prescription been filled recently? Yes  Is the patient out of the medication? Yes  Has the patient been seen for an appointment in the last year OR does the patient have an upcoming appointment? Yes  Can we respond through MyChart? Yes  Agent: Please be advised that Rx refills may take up to 3 business days. We ask that you follow-up with your pharmacy.

## 2023-09-11 ENCOUNTER — Encounter: Payer: Self-pay | Admitting: *Deleted

## 2023-09-11 NOTE — Telephone Encounter (Signed)
 Requested medication (s) are due for refill today:   Yes  Requested medication (s) are on the active medication list:   Yes  Future visit scheduled:   No   LOV 07/20/2022    MyChart message sent to make an appt.   Last ordered: 07/20/2022 #90, 3 refills  Unable to refill because OV and PHQ-2 or 9 due.     (Pt going out of town and was requesting a refill)    Requested Prescriptions  Pending Prescriptions Disp Refills   traZODone  (DESYREL ) 100 MG tablet 90 tablet 3    Sig: Take 1 tablet (100 mg total) by mouth at bedtime.     Psychiatry: Antidepressants - Serotonin Modulator Failed - 09/11/2023  3:52 PM      Failed - Completed PHQ-2 or PHQ-9 in the last 360 days      Failed - Valid encounter within last 6 months    Recent Outpatient Visits           1 year ago GAD (generalized anxiety disorder)   [facility] [place], Butler DASEN, MD

## 2023-11-15 ENCOUNTER — Telehealth: Admitting: Physician Assistant

## 2023-11-15 DIAGNOSIS — J069 Acute upper respiratory infection, unspecified: Secondary | ICD-10-CM | POA: Diagnosis not present

## 2023-11-15 MED ORDER — PSEUDOEPH-BROMPHEN-DM 30-2-10 MG/5ML PO SYRP
5.0000 mL | ORAL_SOLUTION | Freq: Four times a day (QID) | ORAL | 0 refills | Status: AC | PRN
Start: 2023-11-15 — End: ?

## 2023-11-15 MED ORDER — LIDOCAINE VISCOUS HCL 2 % MT SOLN
5.0000 mL | Freq: Four times a day (QID) | OROMUCOSAL | 0 refills | Status: AC | PRN
Start: 2023-11-15 — End: ?

## 2023-11-15 MED ORDER — FLUTICASONE PROPIONATE 50 MCG/ACT NA SUSP
2.0000 | Freq: Every day | NASAL | 0 refills | Status: AC
Start: 2023-11-15 — End: ?

## 2023-11-15 NOTE — Progress Notes (Signed)
 We are sorry you are not feeling well.  Here is how we plan to help!  Based on what you have shared with me, it looks like you may have a viral upper respiratory infection.  Upper respiratory infections are caused by a large number of viruses; however, rhinovirus is the most common cause.   Symptoms vary from person to person, with common symptoms including sore throat, cough, and fatigue or lack of energy, and a feeling of general discomfort.  A low-grade fever of up to 100.4 may present, but is often uncommon.  Symptoms vary however, and are closely related to a person's age or underlying illnesses.  The most common symptoms associated with an upper respiratory infection are nasal discharge or congestion, cough, sneezing, headache and pressure in the ears and face.  These symptoms usually persist for about 3 to 10 days, but can last up to 2 weeks.  It is important to know that upper respiratory infections do not cause serious illness or complications in most cases.    Upper respiratory infections can be transmitted from person to person, with the most common method of transmission being a person's hands.  The virus is able to live on the skin and can infect other persons for up to 2 hours after direct contact.  Also, these can be transmitted when someone coughs or sneezes; thus, it is important to cover the mouth to reduce this risk.  To keep the spread of the illness at bay, good hand hygiene is very important!  Because this is a viral infection, there are no specific treatments other than to help you with the symptoms until the infection runs its course.    For nasal congestion, you may use an oral decongestants such as Mucinex  D or if you have glaucoma or high blood pressure use plain Mucinex .  Saline nasal spray or nasal drops can help and can safely be used as often as needed for congestion.  For your congestion, I have prescribed Fluticasone  nasal spray one spray in each nostril twice a day  If  you do not have a history of heart disease, hypertension, diabetes or thyroid disease, prostate/bladder issues or glaucoma, you may also use Sudafed to treat nasal congestion.  It is highly recommended that you consult with a pharmacist or your primary care physician to ensure this medication is safe for you to take.     If you have a cough, you may use over-the-counter cough suppressants such as Delsym and Robitussin.  If you have glaucoma or high blood pressure, you can also use Coricidin HBP.   For cough I have prescribed for you A prescription cough medication called Bromfed-DM 2-30-10 mg/5 mL.  Take 5 mL every 6 hours as needed for cough.  If you have a sore or scratchy throat, use a saltwater gargle-  to  teaspoon of salt dissolved in a 4-ounce to 8-ounce glass of warm water.  Gargle the solution for approximately 15-30 seconds and then spit.  It is important not to swallow the solution.  You can also use throat lozenges/cough drops and Chloraseptic spray to help with throat pain or discomfort.  Warm or cold liquids can also be helpful in relieving throat pain. I have also prescribed I have prescribed a Viscous Lidocaine 2% solution. Swallow 5-10 mL every 4-6 hours as needed for sore throat. DO NOT eat or drink anything for 15-20 minutes after swallowing to allow the medication to coat the throat.  For headache, pain, or  general discomfort, you can use Ibuprofen or Tylenol  as directed.   Some authorities believe that zinc sprays or the use of Echinacea may shorten the course of your symptoms.  A work note has been provided for you today. It will be available under letters in your MyChart account.  HOME CARE Only take medications as instructed by your medical team. Be sure to drink plenty of fluids. Water is fine as well as fruit juices, sodas and electrolyte beverages. You may want to stay away from caffeine or alcohol. If you are nauseated, try taking small sips of liquids. How do you know if  you are getting enough fluid? Your urine should be a pale yellow or almost colorless. Get rest. Taking a steamy shower or using a humidifier may help nasal congestion and ease sore throat pain. You can place a towel over your head and breathe in the steam from hot water coming from a faucet. Using a saline nasal spray works much the same way. Cough drops, hard candies and sore throat lozenges may ease your cough. Avoid close contacts especially the very young and the elderly Cover your mouth if you cough or sneeze Always remember to wash your hands.   GET HELP RIGHT AWAY IF: You develop worsening fever. If your symptoms do not improve within 10 days You develop yellow or green discharge from your nose over 3 days. You have coughing fits You develop a severe head ache or visual changes. You develop shortness of breath, difficulty breathing or start having chest pain Your symptoms persist after you have completed your treatment plan  MAKE SURE YOU  Understand these instructions. Will watch your condition. Will get help right away if you are not doing well or get worse.  Your e-visit answers were reviewed by a board certified advanced clinical practitioner to complete your personal care plan. Depending upon the condition, your plan could have included both over-the-counter or prescription medications.  Please review your pharmacy choice. If there is a problem, you may call our nursing hot line at and have the prescription routed to another pharmacy. Your safety is important to us . If you have drug allergies, check your prescription carefully.   You can use MyChart to ask questions about today's visit, request a non-urgent call back, or ask for a work or school excuse for 24 hours related to this e-Visit. If it has been greater than 24 hours you will need to follow up with your provider, or enter a new e-Visit to address those concerns. You will get an e-mail in the next two days asking  about your experience.  I hope that your e-visit has been valuable and will speed your recovery. Thank you for using e-visits.   I have spent 5 minutes in review of e-visit questionnaire, review and updating patient chart, medical decision making and response to patient.   Delon CHRISTELLA Dickinson, PA-C
